# Patient Record
Sex: Female | Born: 2000 | Race: White | Hispanic: No | Marital: Single | Smoking: Current some day smoker
Health system: Southern US, Community
[De-identification: ages and names within clinical notes are randomized; demographics above are authoritative.]

## PROBLEM LIST (undated history)

## (undated) DIAGNOSIS — F909 Attention-deficit hyperactivity disorder, unspecified type: Secondary | ICD-10-CM

## (undated) DIAGNOSIS — F419 Anxiety disorder, unspecified: Secondary | ICD-10-CM

## (undated) HISTORY — DX: Attention-deficit hyperactivity disorder, unspecified type: F90.9

## (undated) HISTORY — DX: Anxiety disorder, unspecified: F41.9

## (undated) HISTORY — PX: TONSILLECTOMY: SUR1361

---
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
## 2018-02-02 ENCOUNTER — Other Ambulatory Visit: Payer: Self-pay | Admitting: Internal Medicine

## 2018-02-02 DIAGNOSIS — R059 Cough, unspecified: Secondary | ICD-10-CM

## 2018-02-02 DIAGNOSIS — R05 Cough: Secondary | ICD-10-CM

## 2018-02-03 ENCOUNTER — Ambulatory Visit: Admitting: Internal Medicine

## 2018-02-03 ENCOUNTER — Encounter: Payer: Self-pay | Admitting: Internal Medicine

## 2018-02-03 ENCOUNTER — Ambulatory Visit (INDEPENDENT_AMBULATORY_CARE_PROVIDER_SITE_OTHER): Admitting: Internal Medicine

## 2018-02-03 ENCOUNTER — Ambulatory Visit
Admission: RE | Admit: 2018-02-03 | Discharge: 2018-02-03 | Disposition: A | Discharge status: Home / Self Care | Source: Ambulatory Visit | Attending: Internal Medicine | Admitting: Internal Medicine

## 2018-02-03 VITALS — BP 116/80 | Resp 16 | Ht 64.6 in | Wt 244.0 lb

## 2018-02-03 DIAGNOSIS — R05 Cough: Secondary | ICD-10-CM | POA: Insufficient documentation

## 2018-02-03 DIAGNOSIS — J4541 Moderate persistent asthma with (acute) exacerbation: Secondary | ICD-10-CM

## 2018-02-03 DIAGNOSIS — R059 Cough, unspecified: Secondary | ICD-10-CM

## 2018-02-03 DIAGNOSIS — Z23 Encounter for immunization: Secondary | ICD-10-CM

## 2018-02-03 MED ORDER — ALBUTEROL SULFATE HFA 108 (90 BASE) MCG/ACT IN AERS: 1.0000 | INHALATION_SPRAY | Freq: Four times a day (QID) | RESPIRATORY_TRACT | 5 refills | Status: DC | PRN

## 2018-02-03 MED ORDER — FLUTICASONE FUROATE 200 MCG/ACT IN AEPB: 1.0000 | INHALATION_SPRAY | Freq: Every day | RESPIRATORY_TRACT | 11 refills | Status: DC

## 2018-02-03 NOTE — Progress Notes (Signed)
[name]      Assessment and Plan:  Cough. - Recurrent, suspect secondary to asthmatic bronchitis versus asthma. -  patient is given an inhaler for rescue to be used as needed.  Asthma/asthmatic bronchitis. - Patient is asked to complete her Asmanex sample, then start the prescribed Arnuity inhaler through the winter months.  She is given a co-pay coupon. - She is advised not to sleep with dogs in the bedroom when she goes home. - Flu shot today. - We will reevaluate in 5 months at the end of the flu season to see if we can discontinue the inhaled steroid.  Meds ordered this encounter  Medications  . Fluticasone Furoate (ARNUITY ELLIPTA) 200 MCG/ACT AEPB    Sig: Inhale 1 puff into the lungs daily.    Dispense:  30 each    Refill:  11  . albuterol (PROAIR HFA) 108 (90 Base) MCG/ACT inhaler    Sig: Inhale 1-2 puffs into the lungs every 6 (six) hours as needed for wheezing or shortness of breath.    Dispense:  1 Inhaler    Refill:  5   Return in about 5 months (around 07/05/2018).   Date: 02/03/2018  MRN# [id number] Dana Cooper Nov 07, 2000    Bre Dana Cooper is a 17 y.o. old female seen in consultation for chief complaint of:    Chief Complaint  Patient presents with  . Cough    x 1 week patient stop Asmanex after feeling better. Cough resumed with wheezing.    HPI:  The patient is a 17 year old female diagnosed with walking pneumonia in August 2019.  She had persistent cough that initially went away but then came back.  She was initially treated with a course of prednisone, doxycycline, codeine cough syrup, Tessalon. At last visit she was thought to have asthmatic bronchitis/postinfectious cough.  She was asked to take Tessalon, Robitussin, Asmanex inhaler.  She did the Asmanex inhaler and felt that improvement with complete resolution in her cough.   She subsequently stopped the Asmanex but then the cough recurred, and is now been present  for approximately 1 week. She feels that her breathing is short when she is coughing, but is not otherwise limited by breathing issues. She has 4 dogs at home in [place] where she goes home to during holidays. She has dog allergies, and has taken allergy shots, not taking them currently.  When she travels back home her dog sleep in bed with her.  She has several other environmental allergies.   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  **Chest x-ray 02/03/2018>> imaging personally reviewed, findings of chronic bronchitis, lungs are otherwise unremarkable.  Medication:    Current Outpatient Medications:  .  cetirizine (ZYRTEC) 10 MG tablet, Take 10 mg by mouth daily., Disp: , Rfl:  .  Mometasone Furoate (ASMANEX HFA) 200 MCG/ACT AERO, Inhale 1 puff into the lungs 2 (two) times daily., Disp: 13 g, Rfl: 0   Allergies:  Patient has no known allergies.  Review of Systems:  Constitutional: Feels well. Cardiovascular: Denies chest pain, exertional chest pain.  Pulmonary: Denies hemoptysis, pleuritic chest pain.   The remainder of systems were reviewed and were found to be negative other than what is documented in the HPI.    Physical Examination:   VS: BP 116/80   Resp 16   Ht 5' 4.6" (1.641 m)   Wt 244 lb (110.7 kg)   BMI 41.11 kg/m   General Appearance:  No distress  Neuro:without focal findings, mental status, speech normal, alert and oriented HEENT: PERRLA, EOM intact Pulmonary: No wheezing, No rales  CardiovascularNormal S1,S2.  No m/r/g.  Abdomen: Benign, Soft, non-tender, No masses Renal:  No costovertebral tenderness  GU:  No performed at this time. Endoc: No evident thyromegaly, no signs of acromegaly or Cushing features Skin:   warm, no rashes, no ecchymosis  Extremities: normal, no cyanosis, clubbing.      LABORATORY PANEL:   CBC No results for input(s): WBC, HGB, HCT, PLT in the last 168  hours. ------------------------------------------------------------------------------------------------------------------  Chemistries  No results for input(s): NA, K, CL, CO2, GLUCOSE, BUN, CREATININE, CALCIUM, MG, AST, ALT, ALKPHOS, BILITOT in the last 168 hours.  Invalid input(s): GFRCGP ------------------------------------------------------------------------------------------------------------------  Cardiac Enzymes No results for input(s): TROPONINI in the last 168 hours. ------------------------------------------------------------  RADIOLOGY:  No results found.     Thank  you for the consultation and for allowing [facility], Critical Care to assist in the care of your patient. Our recommendations are noted above.  Please contact us if we can be of further service.   Wells Guiles, M.D., F.C.C.P.  Board Certified in Internal Medicine, Pulmonary Medicine, Critical Care Medicine, and Sleep Medicine.   Pulmonary and Critical Care Office Number: [phone]   02/03/2018

## 2018-02-03 NOTE — Patient Instructions (Addendum)
Restart asmanex inhaler, once completed, start the arnuity inhaler that is prescribed today. Rinse mouth after use.  Keep pets out of bedroom.  Flu shot today.

## 2018-02-06 ENCOUNTER — Other Ambulatory Visit: Payer: Self-pay

## 2018-02-06 MED ORDER — FLUTICASONE PROPIONATE (INHAL) 50 MCG/BLIST IN AEPB: 1.0000 | INHALATION_SPRAY | Freq: Two times a day (BID) | RESPIRATORY_TRACT | 12 refills | Status: DC

## 2018-02-14 ENCOUNTER — Other Ambulatory Visit: Payer: Self-pay | Admitting: Internal Medicine

## 2018-02-14 NOTE — Telephone Encounter (Signed)
Asmanex not covered by patient insurance. Flovent was sent on 11/4. Called patient to see if she has started therapy yet. Patient has not yet picked up Asmanex. Nothing further needed.

## 2018-04-07 ENCOUNTER — Other Ambulatory Visit: Payer: Self-pay | Admitting: Nurse Practitioner

## 2018-09-07 ENCOUNTER — Telehealth: Payer: Self-pay | Admitting: Internal Medicine

## 2018-09-07 NOTE — Telephone Encounter (Signed)
Received PA request from [facility] for Arnuity.  Pt last seen 02/04/2019 and was instructed to f/u in 28mo. She does not currently have a pending appt.  Pt was instructed to restart asmanex inhaler, once completed, start the arnuity. Per 02/14/18 refill encounter, flovent was prescribed due to asmanex not being covered by insurance. Per CMM, covered alterative for Arnuity is Flovent.   I have spoke to pt to verify what inhaler she is currently taking. Pt stated that she is not currently taking any inhaler.  I have offered pt an appointment to discuss mediations. Pt stated that she has a ov with PCP today, due to cough. Pt stated that she would contact our office to schedule an appointment after her visit with PCP.  Nothing further is needed at this time.

## 2019-10-22 ENCOUNTER — Emergency Department
Admission: EM | Admit: 2019-10-22 | Discharge: 2019-10-22 | Disposition: A | Discharge status: Home / Self Care | Source: Home / Self Care | Attending: Emergency Medicine | Admitting: Emergency Medicine

## 2019-10-22 ENCOUNTER — Emergency Department
Admission: EM | Admit: 2019-10-22 | Discharge: 2019-10-22 | Discharge status: Against Medical Advice | Attending: Emergency Medicine | Admitting: Emergency Medicine

## 2019-10-22 ENCOUNTER — Encounter: Payer: Self-pay | Admitting: Emergency Medicine

## 2019-10-22 ENCOUNTER — Other Ambulatory Visit: Payer: Self-pay

## 2019-10-22 ENCOUNTER — Emergency Department

## 2019-10-22 DIAGNOSIS — R1031 Right lower quadrant pain: Secondary | ICD-10-CM

## 2019-10-22 DIAGNOSIS — F909 Attention-deficit hyperactivity disorder, unspecified type: Secondary | ICD-10-CM | POA: Insufficient documentation

## 2019-10-22 LAB — CBC
HCT: 43 % (ref 36.0–46.0)
Hemoglobin: 14.1 g/dL (ref 12.0–15.0)
MCH: 28.6 pg (ref 26.0–34.0)
MCHC: 32.8 g/dL (ref 30.0–36.0)
MCV: 87.2 fL (ref 80.0–100.0)
Platelets: 222 10*3/uL (ref 150–400)
RBC: 4.93 MIL/uL (ref 3.87–5.11)
RDW: 12.5 % (ref 11.5–15.5)
WBC: 6.3 10*3/uL (ref 4.0–10.5)
nRBC: 0 % (ref 0.0–0.2)

## 2019-10-22 LAB — WET PREP, GENITAL
Clue Cells Wet Prep HPF POC: NONE SEEN
Sperm: NONE SEEN
Trich, Wet Prep: NONE SEEN
Yeast Wet Prep HPF POC: NONE SEEN

## 2019-10-22 LAB — COMPREHENSIVE METABOLIC PANEL
ALT: 16 U/L (ref 0–44)
AST: 19 U/L (ref 15–41)
Albumin: 4.2 g/dL (ref 3.5–5.0)
Alkaline Phosphatase: 68 U/L (ref 38–126)
Anion gap: 7 (ref 5–15)
BUN: 10 mg/dL (ref 6–20)
CO2: 28 mmol/L (ref 22–32)
Calcium: 9.5 mg/dL (ref 8.9–10.3)
Chloride: 101 mmol/L (ref 98–111)
Creatinine, Ser: 0.78 mg/dL (ref 0.44–1.00)
GFR calc Af Amer: >60 mL/min (ref >60)
GFR calc non Af Amer: >60 mL/min (ref >60)
Glucose, Bld: 93 mg/dL (ref 70–99)
Potassium: 3.7 mmol/L (ref 3.5–5.1)
Sodium: 136 mmol/L (ref 135–145)
Total Bilirubin: 0.8 mg/dL (ref 0.3–1.2)
Total Protein: 8 g/dL (ref 6.5–8.1)

## 2019-10-22 LAB — URINALYSIS, COMPLETE (UACMP) WITH MICROSCOPIC
Bilirubin Urine: NEGATIVE
Glucose, UA: NEGATIVE mg/dL
Ketones, ur: NEGATIVE mg/dL
Leukocytes,Ua: NEGATIVE
Nitrite: NEGATIVE
Protein, ur: NEGATIVE mg/dL
Specific Gravity, Urine: 1.011 (ref 1.005–1.030)
pH: 7 (ref 5.0–8.0)

## 2019-10-22 LAB — CHLAMYDIA/NGC RT PCR (ARMC ONLY)
Chlamydia Tr: NOT DETECTED
N gonorrhoeae: NOT DETECTED

## 2019-10-22 LAB — POCT PREGNANCY, URINE: Preg Test, Ur: NEGATIVE

## 2019-10-22 LAB — LIPASE, BLOOD: Lipase: 27 U/L (ref 11–51)

## 2019-10-22 MED ORDER — ACETAMINOPHEN 500 MG PO TABS
1000.0000 mg | ORAL_TABLET | Freq: Once | ORAL | Status: AC
  Administered 2019-10-22: 1000 mg via ORAL
  Filled 2019-10-22: qty 2

## 2019-10-22 NOTE — ED Triage Notes (Signed)
Patient reports intermittent right lower abdominal pain times one month that has become worse over the past 2- 3 days.

## 2019-10-22 NOTE — ED Triage Notes (Signed)
Patient reports rlq abdominal pain off/on for the past month, worse over past 2-3 days.  Patient reports history of PCOS and IUD placement.

## 2019-10-22 NOTE — ED Provider Notes (Signed)
[facility] Emergency Department Provider Note   ____________________________________________   None    (approximate)  I have reviewed the triage vital signs and the nursing notes.   HISTORY  Chief Complaint Abdominal Pain   HPI Dana Cooper is a 19 y.o. female presents to the ED with complaint of intermittent right-sided abdominal pain that started approximately 1 month ago.  Patient states that she has also had intermittent fever and pain has gotten worse over the last 2 days.  Patient reports that she has had a white vaginal discharge without odor.  No vaginal itching.       Past Medical History:  Diagnosis Date  . ADHD (attention deficit hyperactivity disorder)   . Anxiety     There are no problems to display for this patient.   Past Surgical History:  Procedure Laterality Date  . TONSILLECTOMY      Prior to Admission medications   Medication Sig Start Date End Date Taking? Authorizing Provider  albuterol (PROAIR HFA) 108 (90 Base) MCG/ACT inhaler Inhale 1-2 puffs into the lungs every 6 (six) hours as needed for wheezing or shortness of breath. 02/03/18   Shane Crutch, MD  cetirizine (ZYRTEC) 10 MG tablet Take 10 mg by mouth daily.    [provider]  fluticasone (FLOVENT DISKUS) 50 MCG/BLIST diskus inhaler Inhale 1 puff into the lungs 2 (two) times daily. 02/06/18   Shane Crutch, MD    Allergies Patient has no known allergies.  Family History  Problem Relation Age of Onset  . Anxiety disorder Mother   . ADD / ADHD Mother   . ADD / ADHD Father     Social History Social History   Tobacco Use  . Smoking status: Never Smoker  . Smokeless tobacco: Never Used  Vaping Use  . Vaping Use: Never used  Substance Use Topics  . Alcohol use: No  . Drug use: No    Review of Systems Constitutional: No fever/chills Eyes: No visual changes. ENT: No sore throat. Cardiovascular: Denies chest  pain. Respiratory: Denies shortness of breath. Gastrointestinal: Positive abdominal pain.  No nausea, no vomiting.  No diarrhea.  No constipation. Genitourinary: Negative for dysuria. Musculoskeletal: Negative for muscle aches. Skin: Negative for rash. Neurological: Negative for headaches, focal weakness or numbness.  ____________________________________________   PHYSICAL EXAM:  VITAL SIGNS: ED Triage Vitals [10/22/19 0534]  Enc Vitals Group     BP 115/77     Pulse Rate 95     Resp 18     Temp 98.5 F (36.9 C)     Temp Source Oral     SpO2 97 %     Weight 250 lb (113.4 kg)     Height 5\' 5"  (1.651 m)     Head Circumference      Peak Flow      Pain Score      Pain Loc      Pain Edu?      Excl. in GC?    Constitutional: Alert and oriented. Well appearing and in no acute distress. Eyes: Conjunctivae are normal.  Head: Atraumatic. Nose: No congestion/rhinnorhea. Neck: No stridor.   Cardiovascular: Normal rate, regular rhythm. Grossly normal heart sounds.  Good peripheral circulation. Respiratory: Normal respiratory effort.  No retractions. Lungs CTAB. Gastrointestinal: Soft.  No distention.  Bowel sounds normoactive x4 quadrants.  No referred pain, McBurney's point tenderness, rebound or  CVA tenderness. Genitourinary: External genitalia is unremarkable.  IUD strings are visible from the cervical  os.  No exudate is noted in the vagina.  No cervical motion tenderness or tenderness on the left ovary.  Right ovary with minimal tenderness and no masses are appreciated. Musculoskeletal: Moves upper and lower extremities with any difficulty normal gait was noted. Neurologic:  Normal speech and language. No gross focal neurologic deficits are appreciated. No gait instability. Skin:  Skin is warm, dry and intact. No rash noted. Psychiatric: Mood and affect are normal. Speech and behavior are normal.  ____________________________________________   LABS (all labs ordered are  listed, but only abnormal results are displayed)  Labs Reviewed  WET PREP, GENITAL - Abnormal; Notable for the following components:      Result Value   WBC, Wet Prep HPF POC MODERATE (*)    All other components within normal limits  CHLAMYDIA/NGC RT PCR ([facility] ONLY)     RADIOLOGY   Official radiology report(s): US PELVIC COMPLETE WITH TRANSVAGINAL  Result Date: 10/22/2019 CLINICAL DATA:  Pelvic pain, RIGHT lower quadrant pain for 1 month, IUD; uncertain LMP EXAM: TRANSABDOMINAL AND TRANSVAGINAL ULTRASOUND OF PELVIS TECHNIQUE: Both transabdominal and transvaginal ultrasound examinations of the pelvis were performed. Transabdominal technique was performed for global imaging of the pelvis including uterus, ovaries, adnexal regions, and pelvic cul-de-sac. It was necessary to proceed with endovaginal exam following the transabdominal exam to visualize the uterus, endometrium, and ovaries. COMPARISON:  None FINDINGS: Uterus Measurements: 6.0 x 3.4 x 4.0 cm = volume: 43 mL. Anteverted. Normal morphology without mass Endometrium Thickness: 6 mm. IUD in expected position at the upper uterine segment endometrial canal. No endometrial fluid or focal abnormality. Right ovary Measurements: 2.8 x 1.6 x 2.1 cm = volume: 5 mL. Normal morphology without mass Left ovary Measurements: 3.0 x 1.3 x 2.1 cm = volume: 4 mL. Normal morphology without mass Other findings No free pelvic fluid.  No adnexal masses. IMPRESSION: IUD in expected position at the upper uterine segment endometrial canal. Otherwise normal exam. Electronically Signed   By: Ulyses Southward M.D.   On: 10/22/2019 13:11    ____________________________________________   PROCEDURES  Procedure(s) performed (including Critical Care):  Procedures   ____________________________________________   INITIAL IMPRESSION / ASSESSMENT AND PLAN / ED COURSE  As part of my medical decision making, I reviewed the following data within the electronic  MEDICAL RECORD NUMBER Notes from prior ED visits and [place] Controlled Substance Database  Lab work and urinalysis that was done earlier today prior to patient eloping from the waiting room due to the wait to be seen in the emergency room was within normal limits.  Patient had some mild tenderness on palpation of the right ovary but no cervical motion tenderness.  Wet prep showed moderate WBCs but without clue cells, trichomonas and gonorrhea and Chlamydia test were negative.  Ultrasound was negative for ovarian cyst or rupture.  Because patient has had intermittent pain for 1 month she is to follow-up with [facility] acute care or return to the emergency department if her symptoms become worse.  She will continue taking Tylenol as needed.  ____________________________________________   FINAL CLINICAL IMPRESSION(S) / ED DIAGNOSES  Final diagnoses:  RLQ abdominal pain     ED Discharge Orders    None       Note:  This document was prepared using Dragon voice recognition software and may include unintentional dictation errors.    Tommi Rumps, PA-C 10/22/19 1403    Emily Filbert, MD 10/22/19 [id number]

## 2019-10-22 NOTE — ED Notes (Signed)
Patient called for vital sign check with no answer. 

## 2019-10-22 NOTE — Discharge Instructions (Addendum)
Follow-up the [facility] acute care if any continued problems or return to the emergency department if not improving or any worsening of your symptoms.  Take Tylenol as needed for pain.  Today your gonorrhea and Chlamydia test were negative and no bacteria was noted.  Lab work is normal.  You may also follow-up with your OB/GYN if any continued problems.

## 2019-10-22 NOTE — ED Notes (Signed)
Patient called for vital signs with no answer. 

## 2019-10-22 NOTE — ED Notes (Signed)
See triage note    Presents with intermittent right sided abd pain  States pain started about 1 month ago  Also has had intermittent fever but is currently afebrile states that the pain has gotten worse over the past 2 days

## 2020-05-12 ENCOUNTER — Emergency Department: Payer: BC Managed Care – PPO

## 2020-05-12 ENCOUNTER — Other Ambulatory Visit: Payer: Self-pay

## 2020-05-12 ENCOUNTER — Emergency Department
Admission: EM | Admit: 2020-05-12 | Discharge: 2020-05-12 | Disposition: A | Discharge status: Home / Self Care | Payer: BC Managed Care – PPO | Attending: Emergency Medicine | Admitting: Emergency Medicine

## 2020-05-12 DIAGNOSIS — Z20822 Contact with and (suspected) exposure to covid-19: Secondary | ICD-10-CM | POA: Insufficient documentation

## 2020-05-12 DIAGNOSIS — F17200 Nicotine dependence, unspecified, uncomplicated: Secondary | ICD-10-CM | POA: Diagnosis not present

## 2020-05-12 DIAGNOSIS — U071 COVID-19: Secondary | ICD-10-CM

## 2020-05-12 DIAGNOSIS — J1282 Pneumonia due to coronavirus disease 2019: Secondary | ICD-10-CM

## 2020-05-12 DIAGNOSIS — J189 Pneumonia, unspecified organism: Secondary | ICD-10-CM | POA: Insufficient documentation

## 2020-05-12 DIAGNOSIS — J069 Acute upper respiratory infection, unspecified: Secondary | ICD-10-CM | POA: Insufficient documentation

## 2020-05-12 DIAGNOSIS — R059 Cough, unspecified: Secondary | ICD-10-CM | POA: Diagnosis present

## 2020-05-12 LAB — CBC WITH DIFFERENTIAL/PLATELET
Abs Immature Granulocytes: 0.02 10*3/uL (ref 0.00–0.07)
Basophils Absolute: 0 10*3/uL (ref 0.0–0.1)
Basophils Relative: 0 %
Eosinophils Absolute: 0.1 10*3/uL (ref 0.0–0.5)
Eosinophils Relative: 1 %
HCT: 40.9 % (ref 36.0–46.0)
Hemoglobin: 13.8 g/dL (ref 12.0–15.0)
Immature Granulocytes: 0 %
Lymphocytes Relative: 19 %
Lymphs Abs: 1.3 10*3/uL (ref 0.7–4.0)
MCH: 28 pg (ref 26.0–34.0)
MCHC: 33.7 g/dL (ref 30.0–36.0)
MCV: 83 fL (ref 80.0–100.0)
Monocytes Absolute: 0.6 10*3/uL (ref 0.1–1.0)
Monocytes Relative: 9 %
Neutro Abs: 4.9 10*3/uL (ref 1.7–7.7)
Neutrophils Relative %: 71 %
Platelets: 146 10*3/uL — ABNORMAL LOW (ref 150–400)
RBC: 4.93 MIL/uL (ref 3.87–5.11)
RDW: 13.2 % (ref 11.5–15.5)
WBC: 7 10*3/uL (ref 4.0–10.5)
nRBC: 0 % (ref 0.0–0.2)

## 2020-05-12 LAB — BASIC METABOLIC PANEL
Anion gap: 10 (ref 5–15)
BUN: 8 mg/dL (ref 6–20)
CO2: 23 mmol/L (ref 22–32)
Calcium: 8.7 mg/dL — ABNORMAL LOW (ref 8.9–10.3)
Chloride: 103 mmol/L (ref 98–111)
Creatinine, Ser: 0.55 mg/dL (ref 0.44–1.00)
GFR, Estimated: >60 mL/min (ref >60)
Glucose, Bld: 97 mg/dL (ref 70–99)
Potassium: 3.8 mmol/L (ref 3.5–5.1)
Sodium: 136 mmol/L (ref 135–145)

## 2020-05-12 LAB — PROTIME-INR
INR: 1.1 (ref 0.8–1.2)
Prothrombin Time: 13.3 seconds (ref 11.4–15.2)

## 2020-05-12 LAB — SARS CORONAVIRUS 2 (TAT 6-24 HRS): SARS Coronavirus 2: NEGATIVE

## 2020-05-12 MED ORDER — SODIUM CHLORIDE 0.9 % IV BOLUS
1000.0000 mL | Freq: Once | INTRAVENOUS | Status: AC
  Administered 2020-05-12: 1000 mL via INTRAVENOUS

## 2020-05-12 MED ORDER — ACETAMINOPHEN 325 MG PO TABS
650.0000 mg | ORAL_TABLET | Freq: Once | ORAL | Status: AC
  Administered 2020-05-12: 650 mg via ORAL
  Filled 2020-05-12: qty 2

## 2020-05-12 MED ORDER — IOHEXOL 350 MG/ML SOLN
75.0000 mL | Freq: Once | INTRAVENOUS | Status: AC | PRN
  Administered 2020-05-12: 75 mL via INTRAVENOUS
  Filled 2020-05-12: qty 75

## 2020-05-12 MED ORDER — PREDNISONE 10 MG (21) PO TBPK: ORAL_TABLET | ORAL | 0 refills | Status: DC

## 2020-05-12 MED ORDER — ALBUTEROL SULFATE HFA 108 (90 BASE) MCG/ACT IN AERS: 2.0000 | INHALATION_SPRAY | RESPIRATORY_TRACT | 1 refills | Status: DC | PRN

## 2020-05-12 MED ORDER — AZITHROMYCIN 250 MG PO TABS: ORAL_TABLET | ORAL | 0 refills | Status: DC

## 2020-05-12 NOTE — ED Triage Notes (Signed)
Pt c/o cough with body aches and fever for the past 3-5 days, pt is in NAD.Marland Kitchen

## 2020-05-12 NOTE — ED Notes (Signed)
See triage note  Presents with cough and low grade fever  States sxs' started about 3-5 days ago   Also having  some discomfort to right "lung" area  States cough is non productive

## 2020-05-12 NOTE — Discharge Instructions (Signed)
Take Tylenol or ibuprofen if needed for pain or fever.  Follow-up with primary care in approximately 4 weeks for repeat chest x-ray.  Quarantine for the next 7 days or until your symptoms are completely gone.

## 2020-05-12 NOTE — ED Provider Notes (Signed)
[facility] Emergency Department Provider Note ____________________________________________   Event Date/Time   First MD Initiated Contact with Patient 05/12/20 [phone]     (approximate)  I have reviewed the triage vital signs and the nursing notes.   HISTORY  Chief Complaint URI  HPI Dana Cooper is a 20 y.o. female with history of pulmonary embolus presents to the emergency department for treatment and evaluation of shortness of breath, cough, fever, body aches, headache x 3-5 days. She feels that the shortness of breath is very similar to when she had a PE in 2020. She is not currently on blood thinners.       Past Medical History:  Diagnosis Date  . ADHD (attention deficit hyperactivity disorder)   . Anxiety     There are no problems to display for this patient.   Past Surgical History:  Procedure Laterality Date  . TONSILLECTOMY      Prior to Admission medications   Medication Sig Start Date End Date Taking? Authorizing Provider  azithromycin (ZITHROMAX) 250 MG tablet 2 tablets today, then 1 tablet for the next 4 days. 05/12/20  Yes Jermani Eberlein B, FNP  predniSONE (STERAPRED UNI-PAK 21 TAB) 10 MG (21) TBPK tablet Take 6 tablets on the first day and decrease by 1 tablet each day until finished. 05/12/20  Yes Shanie Mauzy B, FNP  albuterol (VENTOLIN HFA) 108 (90 Base) MCG/ACT inhaler Inhale 2 puffs into the lungs every 4 (four) hours as needed for wheezing or shortness of breath. 05/12/20   Tayon Parekh, Rulon Eisenmenger B, FNP  cetirizine (ZYRTEC) 10 MG tablet Take 10 mg by mouth daily.    [provider]  fluticasone (FLOVENT DISKUS) 50 MCG/BLIST diskus inhaler Inhale 1 puff into the lungs 2 (two) times daily. 02/06/18   Shane Crutch, MD    Allergies Patient has no known allergies.  Family History  Problem Relation Age of Onset  . Anxiety disorder Mother   . ADD / ADHD Mother   . ADD / ADHD Father     Social History Social History    Tobacco Use  . Smoking status: Current Some Day Smoker  . Smokeless tobacco: Never Used  Vaping Use  . Vaping Use: Never used  Substance Use Topics  . Alcohol use: Yes  . Drug use: Yes    Types: Marijuana    Review of Systems  Constitutional:  Eyes: No visual changes. ENT: No sore throat. Cardiovascular: Denies chest pain. Respiratory: Positive for shortness of breath. Gastrointestinal: No abdominal pain.  Positive for nausea, no vomiting.  No diarrhea.  No constipation. Genitourinary: Negative for dysuria. Musculoskeletal: Negative for back pain. Skin: Negative for rash. Neurological: Positive for headaches, negative for focal weakness or numbness. ____________________________________________   PHYSICAL EXAM:  VITAL SIGNS: ED Triage Vitals  Enc Vitals Group     BP 05/12/20 0811 125/85     Pulse Rate 05/12/20 0811 (!) 120     Resp 05/12/20 0811 20     Temp 05/12/20 0811 (!) 100.4 F (38 C)     Temp Source 05/12/20 0811 Oral     SpO2 05/12/20 0811 95 %     Weight 05/12/20 0843 249 lb 1.9 oz (113 kg)     Height 05/12/20 0843 5\' 5"  (1.651 m)     Head Circumference --      Peak Flow --      Pain Score 05/12/20 0811 5     Pain Loc --  Pain Edu? --      Excl. in GC? --     Constitutional: Alert and oriented. Well appearing and in no acute distress. Eyes: Conjunctivae are normal. Head: Atraumatic. Nose: No congestion/rhinnorhea. Mouth/Throat: Mucous membranes are moist.  Oropharynx non-erythematous. Neck: No stridor.   Hematological/Lymphatic/Immunilogical: No cervical lymphadenopathy. Cardiovascular: Normal rate, regular rhythm. Grossly normal heart sounds.  Good peripheral circulation. Respiratory: Normal respiratory effort.  No retractions. Lungs CTAB. Gastrointestinal: Soft and nontender. No distention. No abdominal bruits. No CVA tenderness. Genitourinary:  Musculoskeletal: No lower extremity tenderness nor edema.  No joint effusions. Neurologic:   Normal speech and language. No gross focal neurologic deficits are appreciated. No gait instability. Skin:  Skin is warm, dry and intact. No rash noted. Psychiatric: Mood and affect are normal. Speech and behavior are normal.  ____________________________________________   LABS (all labs ordered are listed, but only abnormal results are displayed)  Labs Reviewed  BASIC METABOLIC PANEL - Abnormal; Notable for the following components:      Result Value   Calcium 8.7 (*)    All other components within normal limits  CBC WITH DIFFERENTIAL/PLATELET - Abnormal; Notable for the following components:   Platelets 146 (*)    All other components within normal limits  SARS CORONAVIRUS 2 (TAT 6-24 HRS)  PROTIME-INR  POC URINE PREG, ED   ____________________________________________  EKG  Not indicated. ____________________________________________  RADIOLOGY  ED MD interpretation:    CTA negative for PE.  I, Dany Harten, personally viewed and evaluated these images (plain radiographs) as part of my medical decision making, as well as reviewing the written report by the radiologist.  Official radiology report(s): DG Chest 2 View  Result Date: 05/12/2020 CLINICAL DATA:  Shortness of breath. EXAM: CHEST - 2 VIEW COMPARISON:  February 03, 2018. FINDINGS: The heart size and mediastinal contours are within normal limits. No pneumothorax or pleural effusion is noted. Right lung is clear. Minimal left basilar atelectasis or infiltrate is noted. The visualized skeletal structures are unremarkable. IMPRESSION: Minimal left basilar atelectasis or infiltrate is noted. Electronically Signed   By: Lupita Raider M.D.   On: 05/12/2020 08:39   CT Angio Chest PE W and/or Wo Contrast  Result Date: 05/12/2020 CLINICAL DATA:  Suspected PE. Cough and body aches and fever for 3-5 days. EXAM: CT ANGIOGRAPHY CHEST WITH CONTRAST TECHNIQUE: Multidetector CT imaging of the chest was performed using the standard  protocol during bolus administration of intravenous contrast. Multiplanar CT image reconstructions and MIPs were obtained to evaluate the vascular anatomy. CONTRAST:  32mL OMNIPAQUE IOHEXOL 350 MG/ML SOLN COMPARISON:  Chest x-ray of May 12, 2020 FINDINGS: Cardiovascular: Heart size is normal without pericardial effusion. Aortic caliber is normal. Contour of the aorta is smooth. Central pulmonary arteries are normal caliber. No sign of pulmonary embolism to the segmental level. Sub segmental branches show limited assessment. Mediastinum/Nodes: No hilar adenopathy, no mediastinal lymphadenopathy. No axillary lymphadenopathy esophagus grossly normal. Lungs/Pleura: Nodular consolidative changes in the LEFT lung base with associated ground-glass. Similar nodularity at the RIGHT lung base though isolated measuring 7 mm. RIGHT upper lobe ground-glass. No lobar level consolidative process. No pleural effusion. Upper Abdomen: Question lobular hepatic contours and top-normal size of the spleen. No pericholecystic stranding, gallbladder is incompletely imaged. Liver density could be indicative of steatosis though phase of contrast does not allow for assessment. Musculoskeletal: No acute musculoskeletal finding. No destructive bone process. Review of the MIP images confirms the above findings. IMPRESSION: 1. No sign of pulmonary embolism  to the segmental level. Sub segmental branches show limited assessment. 2. Signs of multifocal pneumonia, consider the possibility of COVID-19 infection though not entirely typical. Follow-up is suggested to ensure resolution. 3. Question lobular hepatic contours and top-normal size of the spleen. Correlate with any clinical or laboratory evidence of liver disease. Electronically Signed   By: Donzetta Kohut M.D.   On: 05/12/2020 11:59    ____________________________________________   PROCEDURES  Procedure(s) performed (including Critical  Care):  Procedures  ____________________________________________   INITIAL IMPRESSION / ASSESSMENT AND PLAN     20 year old female presents to the emergency department for treatment and evaluation of shortness of breath.  She is concerned that she may have another pulmonary embolus.  She has had 1 in the past but is not currently on any blood thinners.  Symptoms are more consistent with COVID-19.  She is a current cigarette smoker and is on birth control with a history of PE therefore CTA will be performed.  DIFFERENTIAL DIAGNOSIS  COVID-19, COVID-19 pneumonia, pulmonary embolus, URI  ED COURSE  CT does not show concern for pulmonary embolus however it does show likely COVID-19 pneumonia.  She will be treated with azithromycin, prednisone, and albuterol.  Quarantine instructions were discussed.  She is to follow-up with her primary care provider in a few weeks to have a repeat chest x-ray.  She was advised to return to the emergency department for symptoms of change or worsen if she is unable to schedule appointment.    ___________________________________________   FINAL CLINICAL IMPRESSION(S) / ED DIAGNOSES  Final diagnoses:  Pneumonia due to COVID-19 virus     ED Discharge Orders         Ordered    azithromycin (ZITHROMAX) 250 MG tablet        05/12/20 1213    albuterol (VENTOLIN HFA) 108 (90 Base) MCG/ACT inhaler  Every 4 hours PRN,   Status:  Discontinued        05/12/20 1213    predniSONE (STERAPRED UNI-PAK 21 TAB) 10 MG (21) TBPK tablet        05/12/20 1213    albuterol (VENTOLIN HFA) 108 (90 Base) MCG/ACT inhaler  Every 4 hours PRN        05/12/20 1217           Dana Cooper was evaluated in Emergency Department on 05/12/2020 for the symptoms described in the history of present illness. She was evaluated in the context of the global COVID-19 pandemic, which necessitated consideration that the patient might be at risk for infection with the SARS-CoV-2 virus that  causes COVID-19. Institutional protocols and algorithms that pertain to the evaluation of patients at risk for COVID-19 are in a state of rapid change based on information released by regulatory bodies including the CDC and federal and state organizations. These policies and algorithms were followed during the patient's care in the ED.   Note:  This document was prepared using Dragon voice recognition software and may include unintentional dictation errors.   Chinita Pester, FNP 05/12/20 1309    Delton Prairie, MD 05/12/20 1537

## 2020-05-14 ENCOUNTER — Other Ambulatory Visit: Payer: Self-pay

## 2020-05-14 ENCOUNTER — Emergency Department
Admission: EM | Admit: 2020-05-14 | Discharge: 2020-05-14 | Disposition: A | Discharge status: Home / Self Care | Payer: BC Managed Care – PPO | Attending: Emergency Medicine | Admitting: Emergency Medicine

## 2020-05-14 ENCOUNTER — Emergency Department: Payer: BC Managed Care – PPO

## 2020-05-14 DIAGNOSIS — Z86711 Personal history of pulmonary embolism: Secondary | ICD-10-CM | POA: Insufficient documentation

## 2020-05-14 DIAGNOSIS — F17290 Nicotine dependence, other tobacco product, uncomplicated: Secondary | ICD-10-CM | POA: Diagnosis not present

## 2020-05-14 DIAGNOSIS — R0789 Other chest pain: Secondary | ICD-10-CM | POA: Diagnosis not present

## 2020-05-14 DIAGNOSIS — J189 Pneumonia, unspecified organism: Secondary | ICD-10-CM | POA: Insufficient documentation

## 2020-05-14 DIAGNOSIS — R002 Palpitations: Secondary | ICD-10-CM | POA: Diagnosis present

## 2020-05-14 DIAGNOSIS — T40711A Poisoning by cannabis, accidental (unintentional), initial encounter: Secondary | ICD-10-CM | POA: Diagnosis not present

## 2020-05-14 DIAGNOSIS — Z20822 Contact with and (suspected) exposure to covid-19: Secondary | ICD-10-CM | POA: Insufficient documentation

## 2020-05-14 DIAGNOSIS — R0602 Shortness of breath: Secondary | ICD-10-CM | POA: Insufficient documentation

## 2020-05-14 DIAGNOSIS — N3000 Acute cystitis without hematuria: Secondary | ICD-10-CM | POA: Insufficient documentation

## 2020-05-14 DIAGNOSIS — R739 Hyperglycemia, unspecified: Secondary | ICD-10-CM | POA: Insufficient documentation

## 2020-05-14 DIAGNOSIS — R509 Fever, unspecified: Secondary | ICD-10-CM | POA: Insufficient documentation

## 2020-05-14 LAB — COMPREHENSIVE METABOLIC PANEL
ALT: 18 U/L (ref 0–44)
AST: 24 U/L (ref 15–41)
Albumin: 4.3 g/dL (ref 3.5–5.0)
Alkaline Phosphatase: 60 U/L (ref 38–126)
Anion gap: 13 (ref 5–15)
BUN: 8 mg/dL (ref 6–20)
CO2: 21 mmol/L — ABNORMAL LOW (ref 22–32)
Calcium: 9 mg/dL (ref 8.9–10.3)
Chloride: 105 mmol/L (ref 98–111)
Creatinine, Ser: 0.72 mg/dL (ref 0.44–1.00)
GFR, Estimated: >60 mL/min (ref >60)
Glucose, Bld: 262 mg/dL — ABNORMAL HIGH (ref 70–99)
Potassium: 3.5 mmol/L (ref 3.5–5.1)
Sodium: 139 mmol/L (ref 135–145)
Total Bilirubin: 0.9 mg/dL (ref 0.3–1.2)
Total Protein: 7.7 g/dL (ref 6.5–8.1)

## 2020-05-14 LAB — URINALYSIS, COMPLETE (UACMP) WITH MICROSCOPIC
Bacteria, UA: NONE SEEN
Bilirubin Urine: NEGATIVE
Glucose, UA: >=500 mg/dL — AB
Hgb urine dipstick: NEGATIVE
Ketones, ur: NEGATIVE mg/dL
Leukocytes,Ua: NEGATIVE
Nitrite: POSITIVE — AB
Protein, ur: NEGATIVE mg/dL
Specific Gravity, Urine: 1.037 — ABNORMAL HIGH (ref 1.005–1.030)
pH: 7 (ref 5.0–8.0)

## 2020-05-14 LAB — CBC WITH DIFFERENTIAL/PLATELET
Abs Immature Granulocytes: 0.09 10*3/uL — ABNORMAL HIGH (ref 0.00–0.07)
Basophils Absolute: 0 10*3/uL (ref 0.0–0.1)
Basophils Relative: 0 %
Eosinophils Absolute: 0 10*3/uL (ref 0.0–0.5)
Eosinophils Relative: 0 %
HCT: 41.9 % (ref 36.0–46.0)
Hemoglobin: 13.9 g/dL (ref 12.0–15.0)
Immature Granulocytes: 1 %
Lymphocytes Relative: 12 %
Lymphs Abs: 1.4 10*3/uL (ref 0.7–4.0)
MCH: 28.3 pg (ref 26.0–34.0)
MCHC: 33.2 g/dL (ref 30.0–36.0)
MCV: 85.3 fL (ref 80.0–100.0)
Monocytes Absolute: 0.4 10*3/uL (ref 0.1–1.0)
Monocytes Relative: 4 %
Neutro Abs: 9.7 10*3/uL — ABNORMAL HIGH (ref 1.7–7.7)
Neutrophils Relative %: 83 %
Platelets: 204 10*3/uL (ref 150–400)
RBC: 4.91 MIL/uL (ref 3.87–5.11)
RDW: 13.4 % (ref 11.5–15.5)
WBC: 11.6 10*3/uL — ABNORMAL HIGH (ref 4.0–10.5)
nRBC: 0 % (ref 0.0–0.2)

## 2020-05-14 LAB — HEMOGLOBIN A1C
Hgb A1c MFr Bld: 4.9 % (ref 4.8–5.6)
Mean Plasma Glucose: 93.93 mg/dL

## 2020-05-14 LAB — HCG, QUANTITATIVE, PREGNANCY: hCG, Beta Chain, Quant, S: <1 m[IU]/mL (ref <5)

## 2020-05-14 LAB — RESP PANEL BY RT-PCR (FLU A&B, COVID) ARPGX2
Influenza A by PCR: NEGATIVE
Influenza B by PCR: NEGATIVE
SARS Coronavirus 2 by RT PCR: NEGATIVE

## 2020-05-14 LAB — TROPONIN I (HIGH SENSITIVITY): Troponin I (High Sensitivity): 3 ng/L (ref <18)

## 2020-05-14 LAB — MAGNESIUM: Magnesium: 2.2 mg/dL (ref 1.7–2.4)

## 2020-05-14 LAB — PROCALCITONIN: Procalcitonin: <0.1 ng/mL

## 2020-05-14 MED ORDER — IOHEXOL 350 MG/ML SOLN
100.0000 mL | Freq: Once | INTRAVENOUS | Status: AC | PRN
  Administered 2020-05-14: 100 mL via INTRAVENOUS

## 2020-05-14 MED ORDER — LACTATED RINGERS IV BOLUS
1000.0000 mL | Freq: Once | INTRAVENOUS | Status: AC
  Administered 2020-05-14: 1000 mL via INTRAVENOUS

## 2020-05-14 MED ORDER — CEPHALEXIN 500 MG PO CAPS: 500.0000 mg | ORAL_CAPSULE | Freq: Four times a day (QID) | ORAL | 0 refills | Status: AC

## 2020-05-14 NOTE — ED Notes (Signed)
Pt has returned from CT dept -- fluids resumed and cardiac and cont pulse ox resumed.

## 2020-05-14 NOTE — ED Provider Notes (Signed)
[facility] Emergency Department Provider Note  ____________________________________________   Event Date/Time   First MD Initiated Contact with Patient 05/14/20 [phone]     (approximate)  I have reviewed the triage vital signs and the nursing notes.   HISTORY  Chief Complaint Palpitations   HPI Dana Cooper is a 20 y.o. female past medical history of ADHD, anxiety and previous PE not anticoagulated as well as recent diagnosis of community-acquired pneumonia on 2/9 who presents via EMS from home for assessment of some palpitations.  Patient states that she has actually had some cough shortness of breath, chest soreness, and intermittent headaches as well as fevers and chills the last couple of days but developed some palpitations sometimes early this morning after taking a CBD edible.  Patient denies any acute headache, earache, sore throat, vomiting, diarrhea, dysuria, abdominal pain, back pain, rash or extremity pain.  No recent falls or injuries.  She does endorse nicotine vape intermittently but denies any significant EtOH or illicit drug use.  States she has been taking her antibiotics as prescribed.         Past Medical History:  Diagnosis Date  . ADHD (attention deficit hyperactivity disorder)   . Anxiety     There are no problems to display for this patient.   Past Surgical History:  Procedure Laterality Date  . TONSILLECTOMY      Prior to Admission medications   Medication Sig Start Date End Date Taking? Authorizing Provider  cephALEXin (KEFLEX) 500 MG capsule Take 1 capsule (500 mg total) by mouth 4 (four) times daily for 7 days. 05/14/20 05/21/20 Yes Gilles Chiquito, MD  albuterol (VENTOLIN HFA) 108 (90 Base) MCG/ACT inhaler Inhale 2 puffs into the lungs every 4 (four) hours as needed for wheezing or shortness of breath. 05/12/20   Triplett, Rulon Eisenmenger B, FNP  azithromycin (ZITHROMAX) 250 MG tablet 2 tablets today, then 1 tablet for the next 4 days.  05/12/20   Triplett, Rulon Eisenmenger B, FNP  cetirizine (ZYRTEC) 10 MG tablet Take 10 mg by mouth daily.    [provider]  fluticasone (FLOVENT DISKUS) 50 MCG/BLIST diskus inhaler Inhale 1 puff into the lungs 2 (two) times daily. 02/06/18   Shane Crutch, MD    Allergies Patient has no known allergies.  Family History  Problem Relation Age of Onset  . Anxiety disorder Mother   . ADD / ADHD Mother   . ADD / ADHD Father     Social History Social History   Tobacco Use  . Smoking status: Current Some Day Smoker  . Smokeless tobacco: Never Used  Vaping Use  . Vaping Use: Never used  Substance Use Topics  . Alcohol use: Yes  . Drug use: Yes    Types: Marijuana    Review of Systems  Review of Systems  Constitutional: Positive for chills, fever and malaise/fatigue.  HENT: Negative for sore throat.   Eyes: Negative for pain.  Respiratory: Positive for cough and shortness of breath. Negative for stridor.   Cardiovascular: Positive for chest pain and palpitations.  Gastrointestinal: Negative for vomiting.  Genitourinary: Positive for frequency.  Skin: Negative for rash.  Neurological: Negative for seizures, loss of consciousness and headaches.  Psychiatric/Behavioral: Negative for suicidal ideas.  All other systems reviewed and are negative.     ____________________________________________   PHYSICAL EXAM:  VITAL SIGNS: ED Triage Vitals  Enc Vitals Group     BP      Pulse  Resp      Temp      Temp src      SpO2      Weight      Height      Head Circumference      Peak Flow      Pain Score      Pain Loc      Pain Edu?      Excl. in GC?    Vitals:   05/14/20 0240  BP: 125/70  Pulse: (!) 115  Resp: 20  Temp: 98.5 F (36.9 C)  SpO2: 96%   Physical Exam Vitals and nursing note reviewed.  Constitutional:      Appearance: She is well-developed and well-nourished. She is obese.  HENT:     Head: Normocephalic and atraumatic.     Right Ear:  External ear normal.     Left Ear: External ear normal.     Nose: Nose normal.  Eyes:     Conjunctiva/sclera: Conjunctivae normal.  Cardiovascular:     Rate and Rhythm: Regular rhythm. Tachycardia present.     Heart sounds: No murmur heard.   Pulmonary:     Effort: Pulmonary effort is normal. No respiratory distress.     Breath sounds: Normal breath sounds.  Abdominal:     Palpations: Abdomen is soft.     Tenderness: There is no abdominal tenderness.  Musculoskeletal:        General: No edema.     Cervical back: Neck supple.     Right lower leg: No edema.     Left lower leg: No edema.  Skin:    General: Skin is warm and dry.     Capillary Refill: Capillary refill takes less than 2 seconds.  Neurological:     Mental Status: She is alert and oriented to person, place, and time.  Psychiatric:        Mood and Affect: Mood and affect and mood normal.      ____________________________________________   LABS (all labs ordered are listed, but only abnormal results are displayed)  Labs Reviewed  CBC WITH DIFFERENTIAL/PLATELET - Abnormal; Notable for the following components:      Result Value   WBC 11.6 (*)    Neutro Abs 9.7 (*)    Abs Immature Granulocytes 0.09 (*)    All other components within normal limits  COMPREHENSIVE METABOLIC PANEL - Abnormal; Notable for the following components:   CO2 21 (*)    Glucose, Bld 262 (*)    All other components within normal limits  URINALYSIS, COMPLETE (UACMP) WITH MICROSCOPIC - Abnormal; Notable for the following components:   Color, Urine AMBER (*)    APPearance CLEAR (*)    Specific Gravity, Urine 1.037 (*)    Glucose, UA >=500 (*)    Nitrite POSITIVE (*)    All other components within normal limits  RESP PANEL BY RT-PCR (FLU A&B, COVID) ARPGX2  URINE CULTURE  MAGNESIUM  HCG, QUANTITATIVE, PREGNANCY  PROCALCITONIN  HEMOGLOBIN A1C  TROPONIN I (HIGH SENSITIVITY)  TROPONIN I (HIGH SENSITIVITY)    ____________________________________________  EKG  ECG shows sinus tachycardia with ventricular rate of 120, right axis deviation, unremarkable intervals, some nonspecific ST changes in lead II without any other clear evidence of acute ischemia or other significant underlying arrhythmia. ____________________________________________  RADIOLOGY  ED MD interpretation: CTA chest shows no evidence of PE and evidence of improving pneumonia.  No other acute intrathoracic process.  Official radiology report(s): CT Angio Chest PE  W and/or Wo Contrast  Result Date: 05/14/2020 CLINICAL DATA:  20 year old female with palpitations. Multifocal pneumonia on recent CTA. EXAM: CT ANGIOGRAPHY CHEST WITH CONTRAST TECHNIQUE: Multidetector CT imaging of the chest was performed using the standard protocol during bolus administration of intravenous contrast. Multiplanar CT image reconstructions and MIPs were obtained to evaluate the vascular anatomy. CONTRAST:  OMNIPAQUE IOHEXOL 350 MG/ML SOLN COMPARISON:  Chest CTA 05/12/2020. FINDINGS: Cardiovascular: Suboptimal contrast bolus timing in the pulmonary arterial tree. Mild respiratory motion. Still, there is no convincing bilateral pulmonary artery filling defect to suggest pulmonary embolus. Negative visible aorta, proximal great vessels. No cardiomegaly or pericardial effusion. No calcified coronary artery atherosclerosis is evident. Mediastinum/Nodes: Negative.  No lymphadenopathy. Lungs/Pleura: Stable lung volumes. Major airways remain patent. Left lower lobe pneumonia with regressed but not resolved peribronchial opacity in the same distribution seen 2 days ago. No significant consolidation now, largely sub solid ground-glass opacity. Minimal superior segment right lower lobe involvement persists. But no new areas of lung involvement are identified. No pleural effusion. Upper Abdomen: Negative visible liver, spleen, stomach (more distended today).  Musculoskeletal: Negative. Review of the MIP images confirms the above findings. IMPRESSION: 1. Suboptimal pulmonary artery contrast but negative for acute pulmonary embolus. 2. Regression of left lower lobe pneumonia from 2 days ago. Continued mild involvement of the superior segment right lower lobe. No new areas of involvement, no new pulmonary abnormality. Electronically Signed   By: Odessa Fleming M.D.   On: 05/14/2020 04:18    ____________________________________________   PROCEDURES  Procedure(s) performed (including Critical Care):  .1-3 Lead EKG Interpretation Performed by: Gilles Chiquito, MD Authorized by: Gilles Chiquito, MD     Interpretation: abnormal     ECG rate assessment: tachycardic     Rhythm: sinus tachycardia     Ectopy: none     Conduction: normal       ____________________________________________   INITIAL IMPRESSION / ASSESSMENT AND PLAN / ED COURSE      Patient presents with above-stated history exam for assessment of palpitations in the setting of recent diagnosis of pneumonia and associated symptoms described below.  Per EMS this is in the setting of recent ingestion of CBD edible.  Her heart rate per EMS was in the 130s on arrival is 115 otherwise stable vital signs on room air.  She is obese and tachycardic on arrival but otherwise has clear lungs.  Differential includes but is not limited to side effect from CBD, dehydration, symptomatic anemia, sepsis, PE, arrhythmia, ACS/myocarditis and metabolic derangements.  With regard to patient's increased urinary frequency will check for evidence of infection although she has not noted to be slightly hyperglycemic which could be from recent prednisone use.  CT obtained shows improving pneumonia without evidence of PE.  Covid PCR is negative.  ECG shows no clear evidence of ischemia and initial troponin is nonelevated not consistent with myocarditis.  Very low suspicion for ACS at this time.  CBC shows mild  leukocytosis at 11.6 with no acute anemia or other significant ranges.  Certainly possible this is related to patient's infection versus reactive in the setting of prednisone prescription.  CMP remarkable for glucose of 262 with no other significant derangements.  No evidence of acidosis.  Magnesium WNL.  Urine pregnancy test is negative.  UA is consistent with cystitis with positive nitrites.  Will write Rx for Keflex.    On my reassessment patient states she felt much better and her heart rate is noted to be  in the 90s.  Suspect side effect from [facility] edible in the setting of pneumonia and cystitis.  Advised patient to have her sugar rechecked by her PCP on a nonemergent basis and return immediately to the emergency room if she experiences any new or acute worsening of symptoms.  Counseled importance of nicotine and THC of cessation.   ____________________________________________   FINAL CLINICAL IMPRESSION(S) / ED DIAGNOSES  Final diagnoses:  Community acquired pneumonia, unspecified laterality  Palpitations  Cannabis derivative overdose, accidental or unintentional, initial encounter  Hyperglycemia  Acute cystitis without hematuria    Medications  lactated ringers bolus 1,000 mL (1,000 mLs Intravenous New Bag/Given 05/14/20 0256)  iohexol (OMNIPAQUE) 350 MG/ML injection 100 mL (100 mLs Intravenous Contrast Given 05/14/20 0355)     ED Discharge Orders         Ordered    cephALEXin (KEFLEX) 500 MG capsule  4 times daily        05/14/20 0455           Note:  This document was prepared using Dragon voice recognition software and may include unintentional dictation errors.   Gilles Chiquito, MD 05/14/20 [phone]

## 2020-05-14 NOTE — ED Triage Notes (Signed)
[facility]-- c/o heart racing with report of ingesting an edible arrange 4-5hrs pta and recent dx of PNA-- HR 139 for first responders on scene; ems reports HR slowed to 120s upon their arrival and O2 99% on RA - EMS also reports posterior lung sounds CTA with exception of posterior LLL adventitious lung sounds.

## 2020-05-14 NOTE — ED Notes (Signed)
Patient transported to CT 

## 2020-05-14 NOTE — ED Notes (Signed)
Pt agreeable with d/c plan as discussed by Dr Michiel Sites - this nurse has verbally reinforced d/c instructions and provided pt with written copy- pt acknowledges verbal understanding and denies any additional questions, concerns, needs - pt to call an [facility] for transport home

## 2020-05-15 LAB — URINE CULTURE: Culture: <10000 — AB

## 2020-05-19 ENCOUNTER — Encounter: Payer: Self-pay | Admitting: Adult Health

## 2020-05-19 ENCOUNTER — Other Ambulatory Visit: Payer: Self-pay

## 2020-05-19 ENCOUNTER — Ambulatory Visit: Payer: BC Managed Care – PPO | Admitting: Pulmonary Disease

## 2020-05-19 ENCOUNTER — Ambulatory Visit (INDEPENDENT_AMBULATORY_CARE_PROVIDER_SITE_OTHER): Payer: BC Managed Care – PPO | Admitting: Adult Health

## 2020-05-19 DIAGNOSIS — Z720 Tobacco use: Secondary | ICD-10-CM

## 2020-05-19 DIAGNOSIS — J4521 Mild intermittent asthma with (acute) exacerbation: Secondary | ICD-10-CM

## 2020-05-19 DIAGNOSIS — J189 Pneumonia, unspecified organism: Secondary | ICD-10-CM | POA: Diagnosis not present

## 2020-05-19 DIAGNOSIS — J45909 Unspecified asthma, uncomplicated: Secondary | ICD-10-CM | POA: Insufficient documentation

## 2020-05-19 MED ORDER — BUDESONIDE-FORMOTEROL FUMARATE 80-4.5 MCG/ACT IN AERO: 1.0000 | INHALATION_SPRAY | Freq: Two times a day (BID) | RESPIRATORY_TRACT | 1 refills | Status: DC

## 2020-05-19 MED ORDER — BENZONATATE 200 MG PO CAPS: 200.0000 mg | ORAL_CAPSULE | Freq: Three times a day (TID) | ORAL | 1 refills | Status: DC | PRN

## 2020-05-19 NOTE — Patient Instructions (Addendum)
Finish Antibiotics.  Begin Symbicort 80 1 puff Twice daily, rinse after  Albuterol inhaler As needed   Mucinex DM Twice daily  As needed  Cough/congestion  Tessalon Three times a day   Zyrtec 10mg  At bedtime  As needed  Drainage .  Stop smoking  Follow up with in 3 weeks with Dr. or Bogdan Vivona in [place] .  with chest xray  Please contact office for sooner follow up if symptoms do not improve or worsen or seek emergency care

## 2020-05-19 NOTE — Progress Notes (Signed)
@Patient  ID: , female    DOB: [birth year], 20 y.o.   MRN: [id number]  Chief Complaint  Patient presents with  . Follow-up    Referring provider: No ref. provider found  HPI: 20 year old female active smoker (vaping) seen September 2019 for chronic cough and asthmatic bronchitis History of PE while on birth control.   TEST/EVENTS :   05/19/2020 Follow up : Pneumonia, emergency room visit Patient presents from a recent emergency room visit for pneumonia.  Patient says over the last 2 weeks she has had cough congestion sinus pressure body aches and fever.  CT chest showed showed multifocal pneumonia.  Negative for PE.  Patient had COVID-19 in early January.  She had been vaccinated x2 but no booster. Patient does vape.  Says that her roommate recently had bronchitis and they had shared a vape.  Patient denies any hemoptysis, chest pain, orthopnea, edema.  In the emergency room she was treated with a Z-Pak and a prednisone taper.  She went back to the emergency room with ongoing symptoms.  CT on February 9 showed no PE.  And regression of left lower lobe pneumonia.  An improvement in the right lower lobe.  Patient was noted to have a urinary tract infection was put on Keflex.  Patient has now finished her Z-Pak and has a few days left on her Keflex. She says she continues to feel tired has ongoing cough.  No fever or body aches.  Appetite is good with no nausea vomiting or diarrhea.     No Known Allergies  Immunization History  Administered Date(s) Administered  . Influenza,inj,Quad PF,6+ Mos 02/03/2018  . PFIZER(Purple Top)SARS-COV-2 Vaccination 06/30/2019, 07/21/2019  . Pneumococcal-Unspecified 09/06/2017    Past Medical History:  Diagnosis Date  . ADHD (attention deficit hyperactivity disorder)   . Anxiety     Tobacco History: Social History   Tobacco Use  Smoking Status Current Some Day Smoker  . Types: E-cigarettes  Smokeless Tobacco Never Used  Tobacco  Comment   uses vape daily, 5 times daily   Ready to quit: No Counseling given: Yes Comment: uses vape daily, 5 times daily   Outpatient Medications Prior to Visit  Medication Sig Dispense Refill  . albuterol (VENTOLIN HFA) 108 (90 Base) MCG/ACT inhaler Inhale 2 puffs into the lungs every 4 (four) hours as needed for wheezing or shortness of breath. 1 each 1  . buPROPion (WELLBUTRIN XL) 150 MG 24 hr tablet     . cephALEXin (KEFLEX) 500 MG capsule Take 1 capsule (500 mg total) by mouth 4 (four) times daily for 7 days. 28 capsule 0  . FLUoxetine (PROZAC) 40 MG capsule     . norethindrone (MICRONOR) 0.35 MG tablet Norlyda 0.35 mg tablet    . azithromycin (ZITHROMAX) 250 MG tablet 2 tablets today, then 1 tablet for the next 4 days. (Patient not taking: Reported on 05/19/2020) 6 each 0  . cetirizine (ZYRTEC) 10 MG tablet Take 10 mg by mouth daily. (Patient not taking: Reported on 05/19/2020)    . fluticasone (FLOVENT DISKUS) 50 MCG/BLIST diskus inhaler Inhale 1 puff into the lungs 2 (two) times daily. 1 Inhaler 12  . methylPREDNISolone (MEDROL DOSEPAK) 4 MG TBPK tablet methylprednisolone 4 mg tablets in a dose pack (Patient not taking: Reported on 05/19/2020)     No facility-administered medications prior to visit.     Review of Systems:   Constitutional:   No  weight loss, night sweats,  Fevers, chills, + fatigue, or  lassitude.  HEENT:   No headaches,  Difficulty swallowing,  Tooth/dental problems, or  Sore throat,                No sneezing, itching, ear ache,  +nasal congestion, post nasal drip,   CV:  No chest pain,  Orthopnea, PND, swelling in lower extremities, anasarca, dizziness, palpitations, syncope.   GI  No heartburn, indigestion, abdominal pain, nausea, vomiting, diarrhea, change in bowel habits, loss of appetite, bloody stools.   Resp:   No chest wall deformity  Skin: no rash or lesions.  GU: no dysuria, change in color of urine, no urgency or frequency.  No flank pain,  no hematuria   MS:  No joint pain or swelling.  No decreased range of motion.  No back pain.    Physical Exam  BP 90/70 (BP Location: Left Arm, Cuff Size: Large)   Pulse (!) 103   Temp 97.7 F (36.5 C) (Temporal)   Ht 5\' 5"  (1.651 m)   Wt 262 lb 9.6 oz (119.1 kg)   SpO2 99%   BMI 43.70 kg/m   GEN: A/Ox3; pleasant , NAD, BMI 43   HEENT:  [place]/AT,   NOSE-clear, THROAT-clear, no lesions, no postnasal drip or exudate noted.   NECK:  Supple w/ fair ROM; no JVD; normal carotid impulses w/o bruits; no thyromegaly or nodules palpated; no lymphadenopathy.    RESP    Clear  P & A; w/o, wheezes/ rales/ or rhonchi. no accessory muscle use, no dullness to percussion  CARD:  RRR, no m/r/g, no peripheral edema, pulses intact, no cyanosis or clubbing.  GI:   Soft & nt; nml bowel sounds; no organomegaly or masses detected.   Musco: Warm bil, no deformities or joint swelling noted.   Neuro: alert, no focal deficits noted.    Skin: Warm, no lesions or rashes    Lab Results:   BMET  No results found for: BNP  ProBNP No results found for: PROBNP  Imaging: DG Chest 2 View  Result Date: 05/12/2020 CLINICAL DATA:  Shortness of breath. EXAM: CHEST - 2 VIEW COMPARISON:  February 03, 2018. FINDINGS: The heart size and mediastinal contours are within normal limits. No pneumothorax or pleural effusion is noted. Right lung is clear. Minimal left basilar atelectasis or infiltrate is noted. The visualized skeletal structures are unremarkable. IMPRESSION: Minimal left basilar atelectasis or infiltrate is noted. Electronically Signed   By: Lupita RaiderJames  Green Jr M.D.   On: 05/12/2020 08:39   CT Angio Chest PE W and/or Wo Contrast  Result Date: 05/14/2020 CLINICAL DATA:  20 year old female with palpitations. Multifocal pneumonia on recent CTA. EXAM: CT ANGIOGRAPHY CHEST WITH CONTRAST TECHNIQUE: Multidetector CT imaging of the chest was performed using the standard protocol during bolus administration of  intravenous contrast. Multiplanar CT image reconstructions and MIPs were obtained to evaluate the vascular anatomy. CONTRAST:  100mL OMNIPAQUE IOHEXOL 350 MG/ML SOLN COMPARISON:  Chest CTA 05/12/2020. FINDINGS: Cardiovascular: Suboptimal contrast bolus timing in the pulmonary arterial tree. Mild respiratory motion. Still, there is no convincing bilateral pulmonary artery filling defect to suggest pulmonary embolus. Negative visible aorta, proximal great vessels. No cardiomegaly or pericardial effusion. No calcified coronary artery atherosclerosis is evident. Mediastinum/Nodes: Negative.  No lymphadenopathy. Lungs/Pleura: Stable lung volumes. Major airways remain patent. Left lower lobe pneumonia with regressed but not resolved peribronchial opacity in the same distribution seen 2 days ago. No significant consolidation now, largely sub solid ground-glass opacity. Minimal superior segment right lower lobe involvement persists.  But no new areas of lung involvement are identified. No pleural effusion. Upper Abdomen: Negative visible liver, spleen, stomach (more distended today). Musculoskeletal: Negative. Review of the MIP images confirms the above findings. IMPRESSION: 1. Suboptimal pulmonary artery contrast but negative for acute pulmonary embolus. 2. Regression of left lower lobe pneumonia from 2 days ago. Continued mild involvement of the superior segment right lower lobe. No new areas of involvement, no new pulmonary abnormality. Electronically Signed   By: Odessa Fleming M.D.   On: 05/14/2020 04:18   CT Angio Chest PE W and/or Wo Contrast  Result Date: 05/12/2020 CLINICAL DATA:  Suspected PE. Cough and body aches and fever for 3-5 days. EXAM: CT ANGIOGRAPHY CHEST WITH CONTRAST TECHNIQUE: Multidetector CT imaging of the chest was performed using the standard protocol during bolus administration of intravenous contrast. Multiplanar CT image reconstructions and MIPs were obtained to evaluate the vascular anatomy.  CONTRAST:  32mL OMNIPAQUE IOHEXOL 350 MG/ML SOLN COMPARISON:  Chest x-ray of May 12, 2020 FINDINGS: Cardiovascular: Heart size is normal without pericardial effusion. Aortic caliber is normal. Contour of the aorta is smooth. Central pulmonary arteries are normal caliber. No sign of pulmonary embolism to the segmental level. Sub segmental branches show limited assessment. Mediastinum/Nodes: No hilar adenopathy, no mediastinal lymphadenopathy. No axillary lymphadenopathy esophagus grossly normal. Lungs/Pleura: Nodular consolidative changes in the LEFT lung base with associated ground-glass. Similar nodularity at the RIGHT lung base though isolated measuring 7 mm. RIGHT upper lobe ground-glass. No lobar level consolidative process. No pleural effusion. Upper Abdomen: Question lobular hepatic contours and top-normal size of the spleen. No pericholecystic stranding, gallbladder is incompletely imaged. Liver density could be indicative of steatosis though phase of contrast does not allow for assessment. Musculoskeletal: No acute musculoskeletal finding. No destructive bone process. Review of the MIP images confirms the above findings. IMPRESSION: 1. No sign of pulmonary embolism to the segmental level. Sub segmental branches show limited assessment. 2. Signs of multifocal pneumonia, consider the possibility of COVID-19 infection though not entirely typical. Follow-up is suggested to ensure resolution. 3. Question lobular hepatic contours and top-normal size of the spleen. Correlate with any clinical or laboratory evidence of liver disease. Electronically Signed   By: Donzetta Kohut M.D.   On: 05/12/2020 11:59      No flowsheet data found.  No results found for: NITRICOXIDE      Assessment & Plan:   CAP (community acquired pneumonia) Multifocal pneumonia.  Possibly secondary to COVID-19 as she had this 1 month ago.  Patient has received antibiotics and steroids and does have some clinical improvement.   She is also on antibiotics for UTI.  I advised her to finish these antibiotics.  We will need a follow-up x-ray on her return visit.  Pending these results can decide if further CT imaging is indicated. Patient is advance activity as tolerated.  Plan  Patient Instructions  Finish Antibiotics.  Begin Symbicort 80 1 puff Twice daily, rinse after  Albuterol inhaler As needed   Mucinex DM Twice daily  As needed  Cough/congestion  Tessalon Three times a day   Zyrtec 10mg  At bedtime  As needed  Drainage .  Stop smoking  Follow up with in 3 weeks with Dr. or Parrett in [place] .  with chest xray  Please contact office for sooner follow up if symptoms do not improve or worsen or seek emergency care       Vapes nicotine containing substance Smoking cessation discussed  Asthmatic bronchitis Asthmatic bronchitic  flare after COVID-19 and pneumonia.  She has completed a course of steroids.  We will add Symbicort 1 puff twice daily for 1 month on return visit can decide if she needs to continue on this.  Plan Patient Instructions  Finish Antibiotics.  Begin Symbicort 80 1 puff Twice daily, rinse after  Albuterol inhaler As needed   Mucinex DM Twice daily  As needed  Cough/congestion  Tessalon Three times a day   Zyrtec 10mg  At bedtime  As needed  Drainage .  Stop smoking  Follow up with in 3 weeks with Dr. or Parrett in [place] .  with chest xray  Please contact office for sooner follow up if symptoms do not improve or worsen or seek emergency care          [place], NP 05/19/2020

## 2020-05-19 NOTE — Assessment & Plan Note (Signed)
Smoking cessation discussed 

## 2020-05-19 NOTE — Assessment & Plan Note (Signed)
Multifocal pneumonia.  Possibly secondary to COVID-19 as she had this 1 month ago.  Patient has received antibiotics and steroids and does have some clinical improvement.  She is also on antibiotics for UTI.  I advised her to finish these antibiotics.  We will need a follow-up x-ray on her return visit.  Pending these results can decide if further CT imaging is indicated. Patient is advance activity as tolerated.  Plan  Patient Instructions  Finish Antibiotics.  Begin Symbicort 80 1 puff Twice daily, rinse after  Albuterol inhaler As needed   Mucinex DM Twice daily  As needed  Cough/congestion  Tessalon Three times a day   Zyrtec 10mg  At bedtime  As needed  Drainage .  Stop smoking  Follow up with in 3 weeks with Dr. or Garrett Bowring in [place] .  with chest xray  Please contact office for sooner follow up if symptoms do not improve or worsen or seek emergency care

## 2020-05-19 NOTE — Assessment & Plan Note (Signed)
Asthmatic bronchitic flare after COVID-19 and pneumonia.  She has completed a course of steroids.  We will add Symbicort 1 puff twice daily for 1 month on return visit can decide if she needs to continue on this.  Plan Patient Instructions  Finish Antibiotics.  Begin Symbicort 80 1 puff Twice daily, rinse after  Albuterol inhaler As needed   Mucinex DM Twice daily  As needed  Cough/congestion  Tessalon Three times a day   Zyrtec 10mg  At bedtime  As needed  Drainage .  Stop smoking  Follow up with in 3 weeks with Dr. or Shameika Speelman in [place] .  with chest xray  Please contact office for sooner follow up if symptoms do not improve or worsen or seek emergency care

## 2020-06-30 ENCOUNTER — Other Ambulatory Visit: Payer: Self-pay

## 2020-06-30 ENCOUNTER — Ambulatory Visit (INDEPENDENT_AMBULATORY_CARE_PROVIDER_SITE_OTHER): Payer: BC Managed Care – PPO | Admitting: Pulmonary Disease

## 2020-06-30 ENCOUNTER — Encounter: Payer: Self-pay | Admitting: Pulmonary Disease

## 2020-06-30 VITALS — BP 110/74 | HR 100 | Temp 97.1°F | Ht 65.0 in | Wt 254.0 lb

## 2020-06-30 DIAGNOSIS — Z720 Tobacco use: Secondary | ICD-10-CM | POA: Diagnosis not present

## 2020-06-30 DIAGNOSIS — J4521 Mild intermittent asthma with (acute) exacerbation: Secondary | ICD-10-CM

## 2020-06-30 NOTE — Patient Instructions (Addendum)
Your lungs sounded really clear today.  Do work on quitting vape use.  Return to see [place] as needed if you develop any  breathing difficulties.

## 2020-06-30 NOTE — Progress Notes (Signed)
Subjective:    Patient ID: Dana Cooper, female    DOB: 01/22/2001, 20 y.o.   MRN: [id number] Chief Complaint  Patient presents with   Follow-up    No current sx.     HPI 20 year old vape user, who presents for follow-up on issues with mild intermittent asthmatic bronchitis.  Fully followed by Dr. Nicholos Johns and last seen by Rubye Oaks, NP on 19 May 2020.  At that time the patient was recovering after COVID 19 diagnosis.  She was started on Symbicort during her last visit but she has discontinued use of this.  She states that she feels like she is "back to normal".  Notes no limitations in her breathing.  She unfortunately continues to vape and has been advised against this. Does not endorse any cough, sputum production, or hemoptysis.  She has not had any fevers, chills or sweats since her COVID-19 diagnosis.  Most of her symptoms of asthmatic bronchitis occurred during the winter months.  Review of Systems A 10 point review of systems was performed and it is as noted above otherwise negative.  Patient Active Problem List   Diagnosis Date Noted   CAP (community acquired pneumonia) 05/19/2020   Vapes nicotine containing substance 05/19/2020   Asthmatic bronchitis 05/19/2020   Social History   Tobacco Use   Smoking status: Some Days    Types: E-cigarettes   Smokeless tobacco: Never   Tobacco comments:    uses vape daily, 5 times daily  Substance Use Topics   Alcohol use: Yes   No Known Allergies  Current Meds  Medication Sig   buPROPion (WELLBUTRIN XL) 150 MG 24 hr tablet    FLUoxetine (PROZAC) 40 MG capsule    norethindrone (MICRONOR) 0.35 MG tablet Norlyda 0.35 mg tablet       Objective:   Physical Exam BP 110/74 (BP Location: Left Arm, Cuff Size: Normal)   Pulse 100   Temp (!) 97.1 F (36.2 C) (Temporal)   Ht 5\' 5"  (1.651 m)   Wt 254 lb (115.2 kg)   SpO2 98%   BMI 42.27 kg/m  GENERAL: Obese young woman, no acute distress, no conversational  dyspnea.  Fully ambulatory. HEAD: Normocephalic, atraumatic.  EYES: Pupils equal, round, reactive to light.  No scleral icterus.  MOUTH: Nose/mouth/throat not examined due to masking requirements for COVID 19. NECK: Supple. No thyromegaly. Trachea midline. No JVD.  No adenopathy. PULMONARY: Good air entry bilaterally.  No adventitious sounds on all lung zones. CARDIOVASCULAR: S1 and S2. Regular rate and rhythm.  ABDOMEN: Obese otherwise benign. MUSCULOSKELETAL: No joint deformity, no clubbing, no edema.  NEUROLOGIC: No focal deficit, no gait disturbance, speech is fluent. SKIN: Intact,warm,dry. PSYCH: Mood and behavior normal.  Chest x-ray requested not performed today.     Assessment & Plan:     ICD-10-CM   1. Mild intermittent asthmatic bronchitis with acute exacerbation - RESOLVED  J45.21    Monitor for recurrent symptoms Follow-up 6 months or on an as-needed basis    2. Vapes nicotine containing substance  Z72.0    Counseled with regards discontinuation of vape use     Patient was advised to continue to monitor for symptoms.  Was also advised to work on quitting vape use.  We will see her in follow-up in 6 months time or on an as-needed basis prior to that.  , MD [facility]    *This note was dictated using voice recognition software/Dragon.  Despite best efforts  to proofread, errors can occur which can change the meaning.  Any change was purely unintentional.

## 2020-11-22 ENCOUNTER — Other Ambulatory Visit: Payer: Self-pay

## 2020-11-22 ENCOUNTER — Emergency Department
Admission: EM | Admit: 2020-11-22 | Discharge: 2020-11-22 | Disposition: A | Discharge status: Home / Self Care | Payer: BC Managed Care – PPO | Attending: Emergency Medicine | Admitting: Emergency Medicine

## 2020-11-22 ENCOUNTER — Emergency Department: Payer: BC Managed Care – PPO

## 2020-11-22 DIAGNOSIS — W228XXA Striking against or struck by other objects, initial encounter: Secondary | ICD-10-CM | POA: Insufficient documentation

## 2020-11-22 DIAGNOSIS — M542 Cervicalgia: Secondary | ICD-10-CM | POA: Diagnosis not present

## 2020-11-22 DIAGNOSIS — J45909 Unspecified asthma, uncomplicated: Secondary | ICD-10-CM | POA: Insufficient documentation

## 2020-11-22 DIAGNOSIS — S0990XA Unspecified injury of head, initial encounter: Secondary | ICD-10-CM | POA: Diagnosis present

## 2020-11-22 DIAGNOSIS — S060X0A Concussion without loss of consciousness, initial encounter: Secondary | ICD-10-CM | POA: Diagnosis not present

## 2020-11-22 DIAGNOSIS — F17290 Nicotine dependence, other tobacco product, uncomplicated: Secondary | ICD-10-CM | POA: Diagnosis not present

## 2020-11-22 DIAGNOSIS — Y9289 Other specified places as the place of occurrence of the external cause: Secondary | ICD-10-CM | POA: Diagnosis not present

## 2020-11-22 DIAGNOSIS — Y9389 Activity, other specified: Secondary | ICD-10-CM | POA: Insufficient documentation

## 2020-11-22 MED ORDER — KETOROLAC TROMETHAMINE 30 MG/ML IJ SOLN
30.0000 mg | Freq: Once | INTRAMUSCULAR | Status: AC
  Administered 2020-11-22: 30 mg via INTRAMUSCULAR
  Filled 2020-11-22: qty 1

## 2020-11-22 NOTE — ED Provider Notes (Signed)
[facility] Emergency Department Provider Note ____________________________________________  Time seen: 1715  I have reviewed the triage vital signs and the nursing notes.  HISTORY  Chief Complaint  Concussion (Pt presents to the ED with complaints of head injury x3 days ago. Pt reports trying to land back on pillow and hitting head on sharp wood. Pt denies LOC. Pt reports nausea, headache, and neck pain. )   HPI Dana Cooper is a 20 y.o. female presents to the ER today with complaint of headache, lightheadedness, excessive sleepiness, neck pain and nausea.  She reports this started 3 days ago after hitting the back of her head on her head forward.  She describes the pain on the right side of her head as throbbing.  She reports sensitivity to light and sound, nausea without vomiting.  She describes the pain in her neck as achy and numb, however this has improved over the last few days.  She does have difficulty with memory but reports this is a chronic issue and she is not sure if it is necessarily worse.  She reports she slept for 6 hours yesterday which is unusual for her.  She has taken Tylenol OTC with minimal relief of symptoms.  She denies history of prior concussion.  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 Authorizing Provider  buPROPion (WELLBUTRIN XL) 150 MG 24 hr tablet     [provider]  FLUoxetine (PROZAC) 40 MG capsule  02/22/19   [provider]  norethindrone (MICRONOR) 0.35 MG tablet Norlyda 0.35 mg tablet    [provider]    Allergies Patient has no known allergies.  Family History  Problem Relation Age of Onset   Anxiety disorder Mother    ADD / ADHD Mother    ADD / ADHD Father     Social History Social History   Tobacco Use   Smoking status: Some Days    Types: E-cigarettes   Smokeless tobacco: Never   Tobacco comments:    uses vape daily, 5 times daily  Vaping Use   Vaping Use: Some days  Substance Use Topics   Alcohol use: Yes   Drug use: Yes    Types: Marijuana    Review of Systems  Constitutional: Negative for fever, chills or body aches. Eyes: Positive for sensitivity to light.  Negative for visual changes. ENT: Positive for sensitivity to sound. Cardiovascular: Negative for chest pain or chest tightness. Respiratory: Negative for cough or shortness of breath. Gastrointestinal: Positive for nausea.  Negative for vomiting. Musculoskeletal: Positive for neck pain.  Negative for back pain. Skin: Negative for bruising, abrasion or hematoma. Neurological: Positive for headache and lightheadedness.  Negative for focal weakness or or tingling. ____________________________________________  PHYSICAL EXAM:  VITAL SIGNS: ED Triage Vitals  Enc Vitals Group     BP 11/22/20 1542 (!) 127/100     Pulse Rate 11/22/20 1542 96     Resp 11/22/20 1542 18     Temp 11/22/20 1542 98.8 F (37.1 C)     Temp Source 11/22/20 1542 Oral  SpO2 11/22/20 1542 99 %     Weight 11/22/20 1543 240 lb (108.9 kg)     Height 11/22/20 1543 5\' 5"  (1.651 m)     Head Circumference --      Peak Flow --      Pain Score 11/22/20 1543 5     Pain Loc --      Pain Edu? --      Excl. in GC? --     Constitutional: Alert and oriented. Well appearing and in no distress. Head: Normocephalic and atraumatic. Eyes:  Normal extraocular movements Cardiovascular: Normal rate, regular rhythm.  Respiratory: Normal respiratory effort. No wheezes/rales/rhonchi. Musculoskeletal: Normal flexion, extension, rotation and  lateral bending of the cervical spine.  No bony tenderness noted over the cervical spine.  Shoulder shrugs equal.  Strength 5/5 BUE. Neurologic:  Normal gait without ataxia. Normal speech and language. No gross focal neurologic deficits are appreciated. Skin:  Skin is warm, dry and intact. No bruising or abrasion noted. ____________________________________________   RADIOLOGY Imaging Orders         CT HEAD WO CONTRAST (11/24/20)         CT Cervical Spine Wo Contrast    IMPRESSION: No acute intracranial abnormality. IMPRESSION: No acute intracranial abnormality. IMPRESSION: No acute cervical spine fracture.  ____________________________________________   INITIAL IMPRESSION / ASSESSMENT AND PLAN / ED COURSE  Acute Headache, Lightheadedness, Acute Neck Pain, Nausea:  CT head negative for acute findings CT cervical spine negative for acute findings Toradol 30 mg IM x 1 Discussed that she likely has a concussion, will need brain rest until symptoms improve Ok to take Tylenol/Ibuprofen OTC but to avoid medication overuse Will have her follow up with neurology as an outpatient    ____________________________________________  FINAL CLINICAL IMPRESSION(S) / ED DIAGNOSES  Final diagnoses:  Concussion without loss of consciousness, initial encounter      , NP 11/22/20 1846    11/24/20, MD 11/22/20 2005

## 2020-11-22 NOTE — Discharge Instructions (Addendum)
You were seen today for a head injury.  The CT of your head and cervical spine are negative for acute findings.  You likely have a concussion.  We recommend brain rest until your symptoms improve.  You can take Tylenol ibuprofen OTC as needed according to the package directions for headaches but avoid overuse as this can cause rebound headaches.  Please follow-up with neurology if symptoms persist or worsen.

## 2021-01-27 ENCOUNTER — Encounter: Payer: Self-pay | Admitting: Pulmonary Disease

## 2022-12-20 IMAGING — CT CT HEAD W/O CM
4 series · 17 of 47 positions shown, 19 images · non-contrast
Comparison: None.

CLINICAL DATA: Head trauma, focal neuro findings (Age 19-64y)

EXAM:
CT HEAD WITHOUT CONTRAST
TECHNIQUE: Contiguous axial images were obtained from the base of the skull
through the vertex without intravenous contrast.

[Series 2: head wo · axial · 0.46mm/px · z∈[+444,+564]mm · 7 of 33 slices shown, 9 images]
[im 5/33  brain]
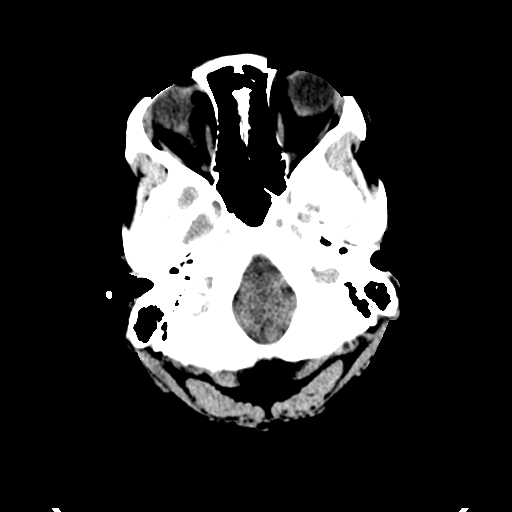
[im 5/33  bone]
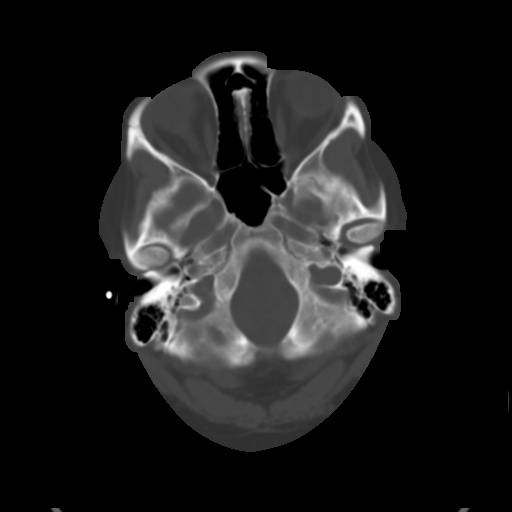
[im 9/33  brain]
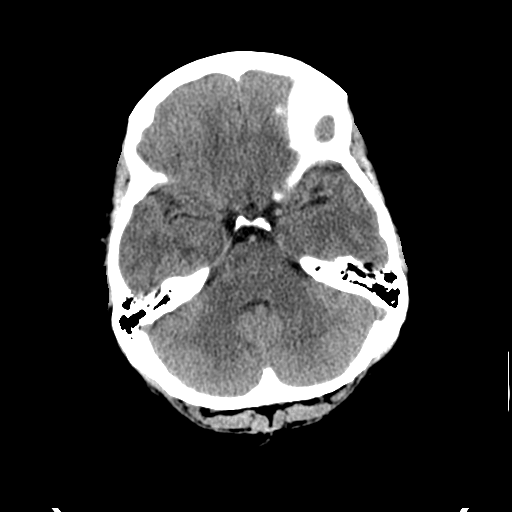
[im 13/33  brain]
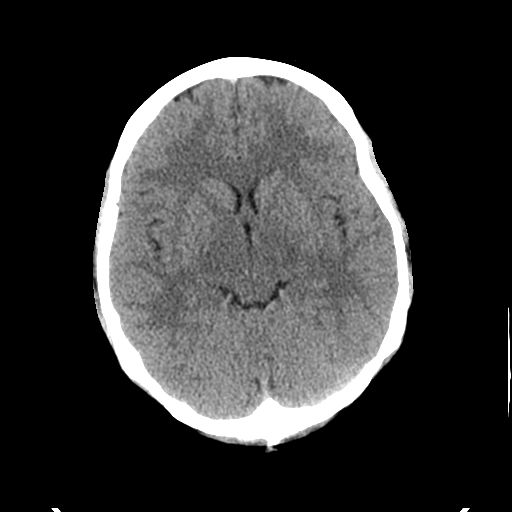
[im 17/33  brain]
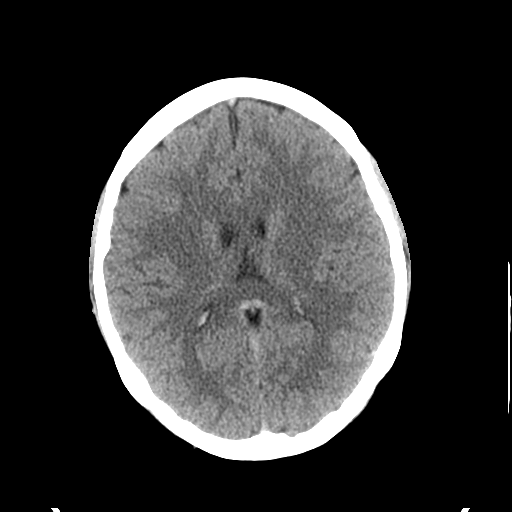
[im 21/33  brain]
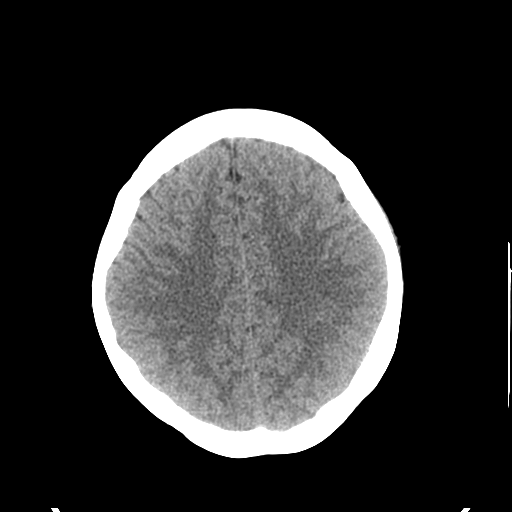
[im 21/33  bone]
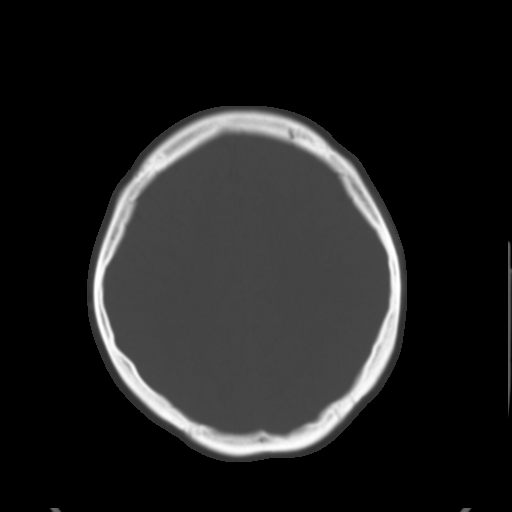
[im 25/33  brain]
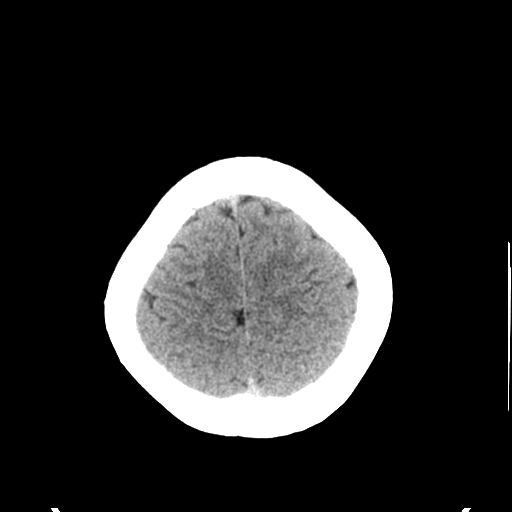
[im 29/33  brain]
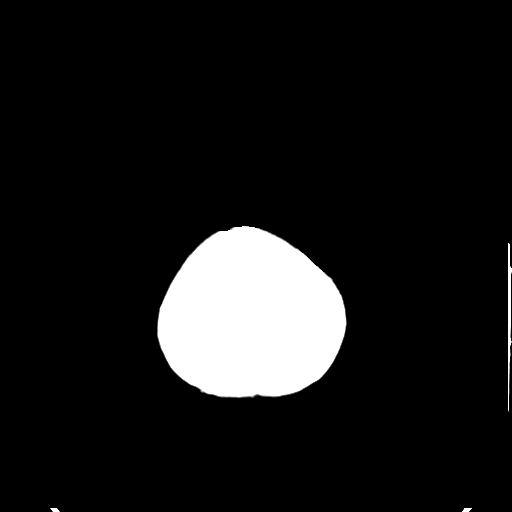

[Series 3: head bone · axial · 0.46mm/px · z∈[+440,+496]mm · 4 of 83 slices shown]
[im 9/83  bone]
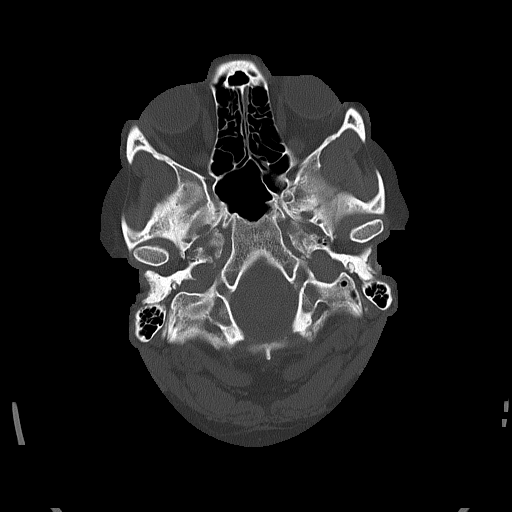
[im 17/83  bone]
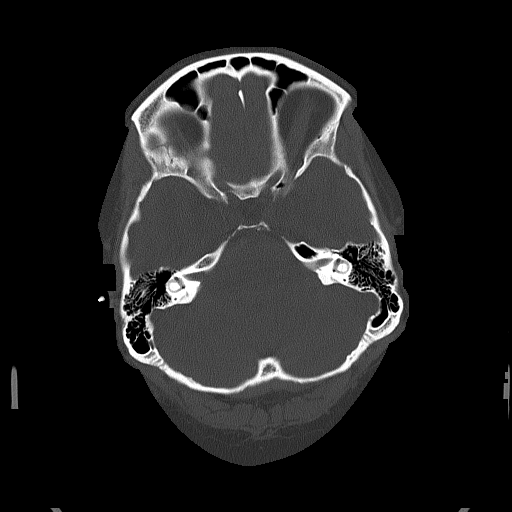
[im 25/83  bone]
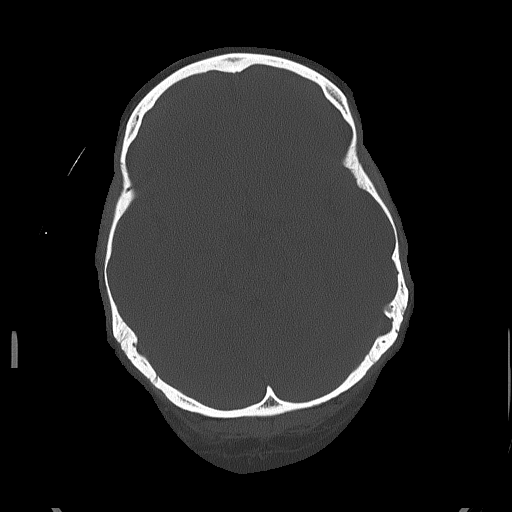
[im 37/83  bone]
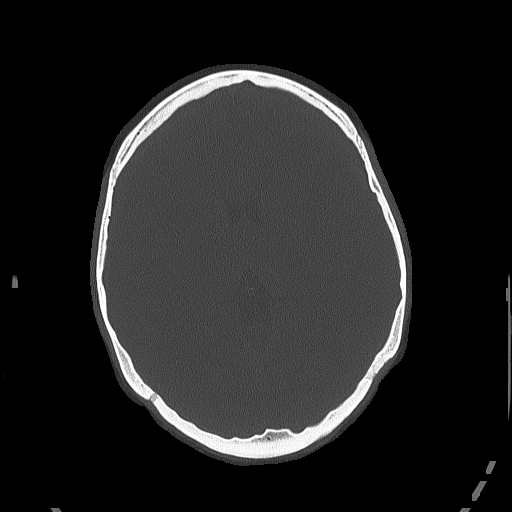

[Series 4: coronal soft tissue · coronal · 0.34mm/px · 3 of 72 slices shown]
[im 24/72  brain]
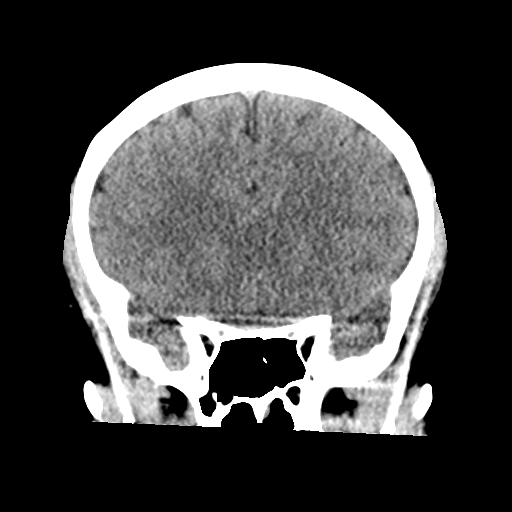
[im 32/72  brain]
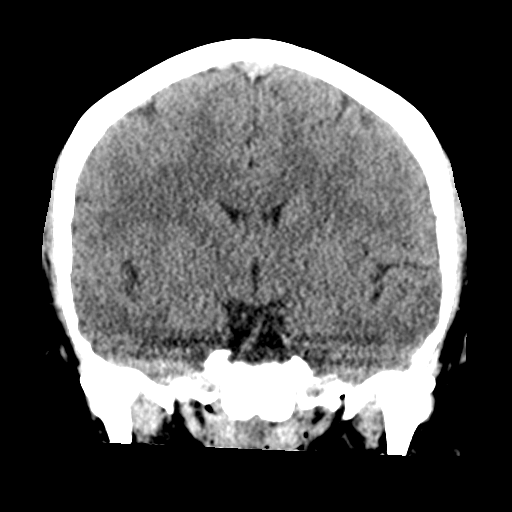
[im 40/72  brain]
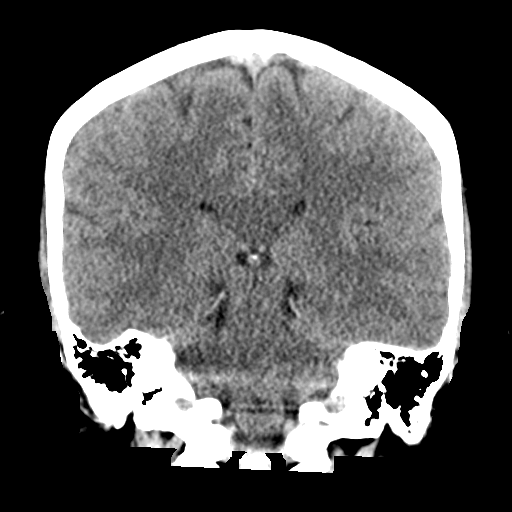

[Series 5: sagittal soft tissue · sagittal · 0.34mm/px · 3 of 59 slices shown]
[im 20/59  brain]
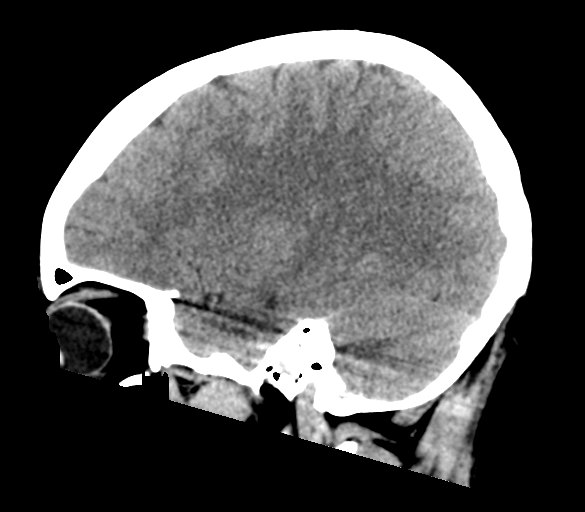
[im 30/59  brain]
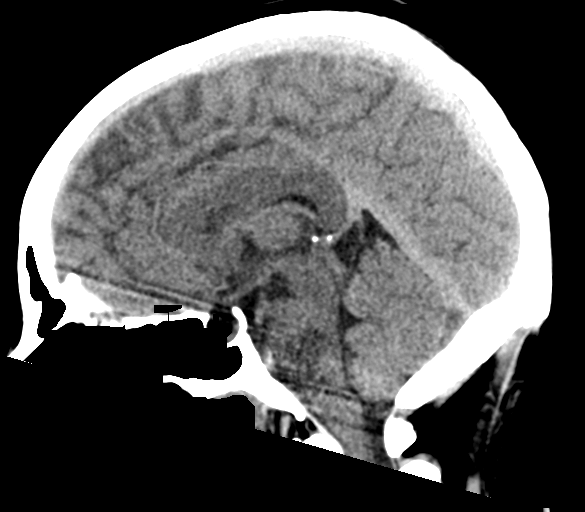
[im 39/59  brain]
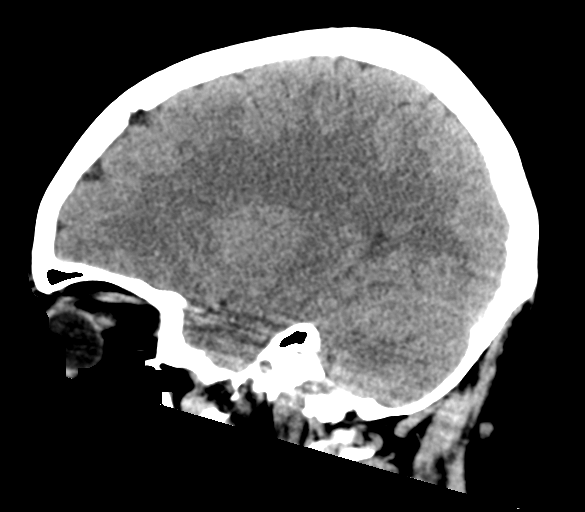

[17 of 47 positions shown; findings below may reference images not displayed]

FINDINGS: Brain: No evidence of acute intracranial hemorrhage or extra-axial
collection.No evidence of mass lesion/concern mass effect.The
ventricles are normal in size.

Vascular: No hyperdense vessel or unexpected calcification.

Skull: Normal. Negative for fracture or focal lesion.

Sinuses/Orbits: No acute finding.

Other: None.
IMPRESSION: No acute intracranial abnormality.

## 2022-12-20 IMAGING — CT CT CERVICAL SPINE W/O CM
3 of 4 series · 13 of 33 positions shown, 16 images · non-contrast
Comparison: None.

CLINICAL DATA: Neck trauma, abnormal mental status or neuro exam
(Ped 3-15y)

EXAM:
CT CERVICAL SPINE WITHOUT CONTRAST
TECHNIQUE: Multidetector CT imaging of the cervical spine was performed without
intravenous contrast. Multiplanar CT image reconstructions were also
generated.

[Series 6: orthogonal bone · axial · 0.27mm/px · z∈[+260,+390]mm · 5 of 100 slices shown, 7 images]
[im 17/100  soft-tissue]
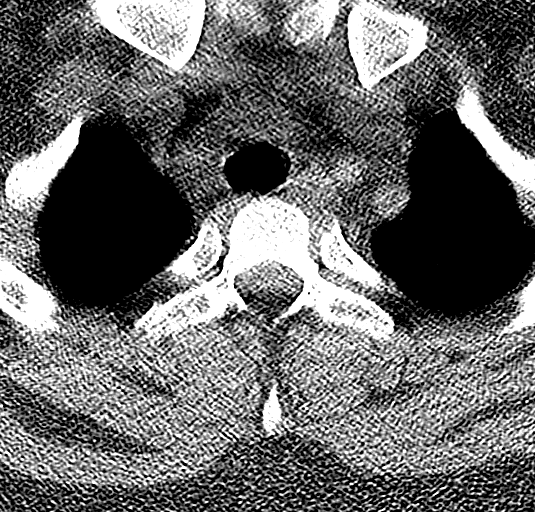
[im 17/100  bone]
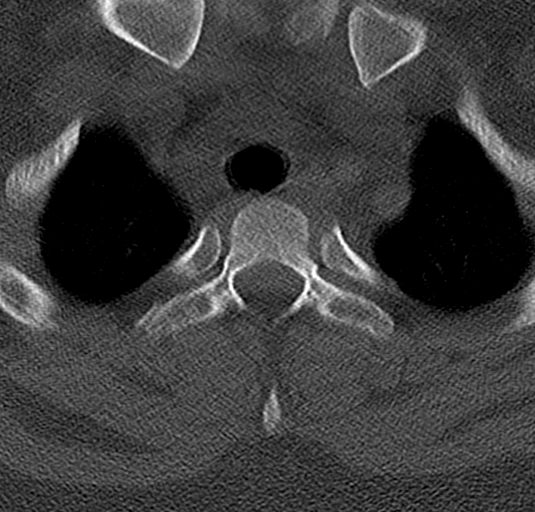
[im 34/100  bone]
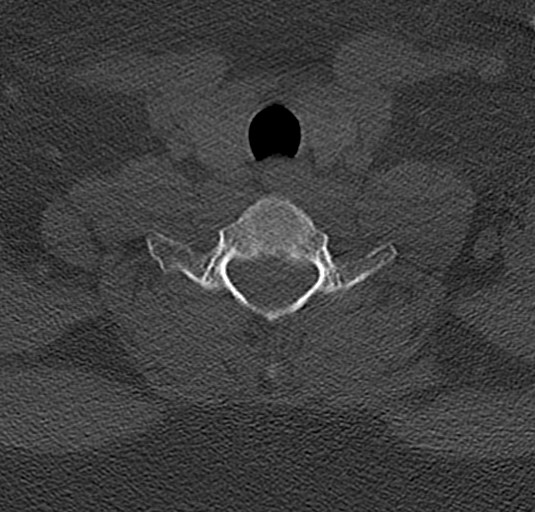
[im 50/100  bone]
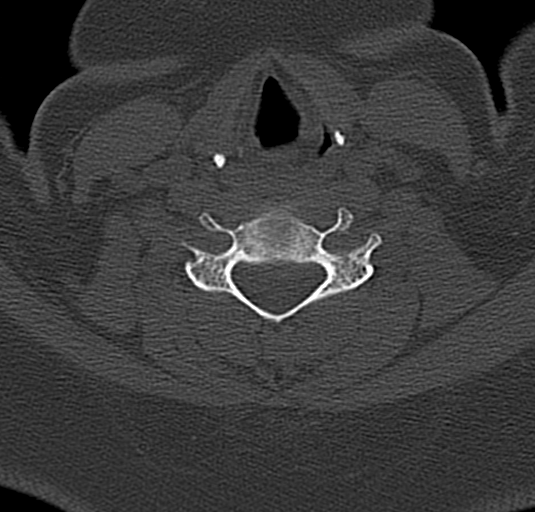
[im 67/100  bone]
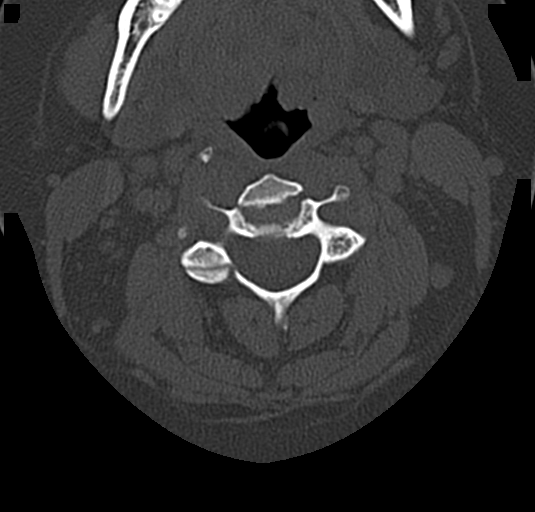
[im 83/100  soft-tissue]
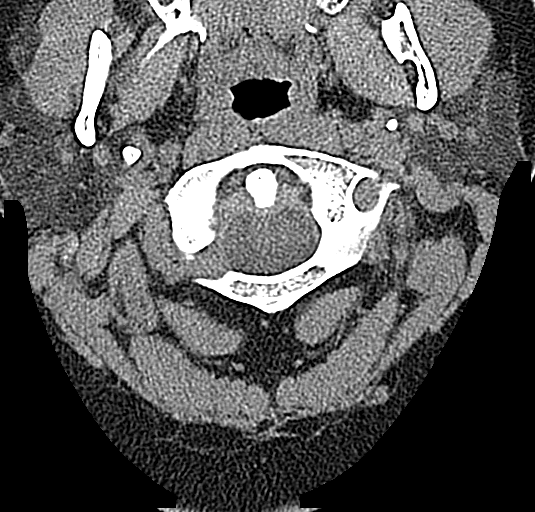
[im 83/100  bone]
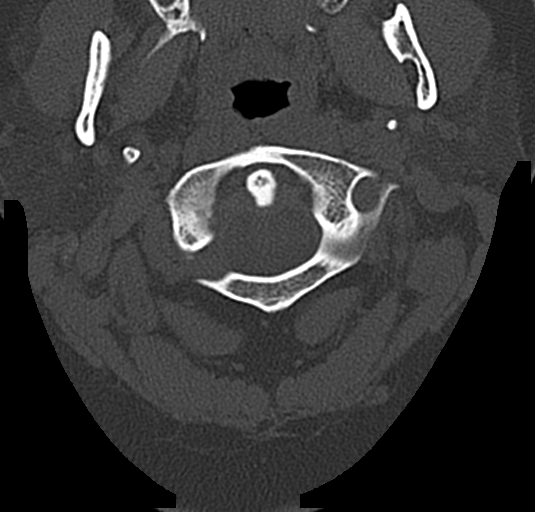

[Series 7: sagittal bone · sagittal · 0.30mm/px · 5 of 84 slices shown, 6 images]
[im 28/84  bone]
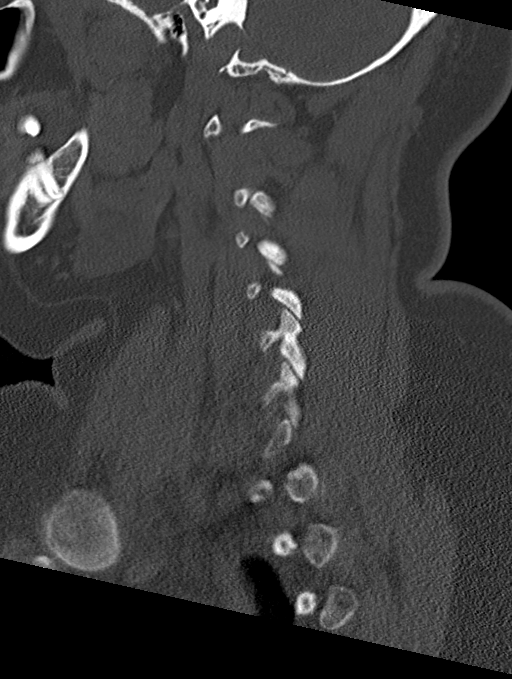
[im 35/84  bone]
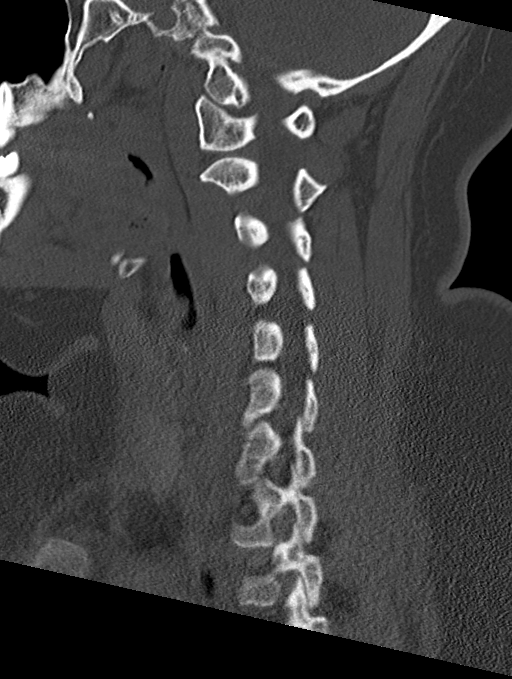
[im 42/84  soft-tissue]
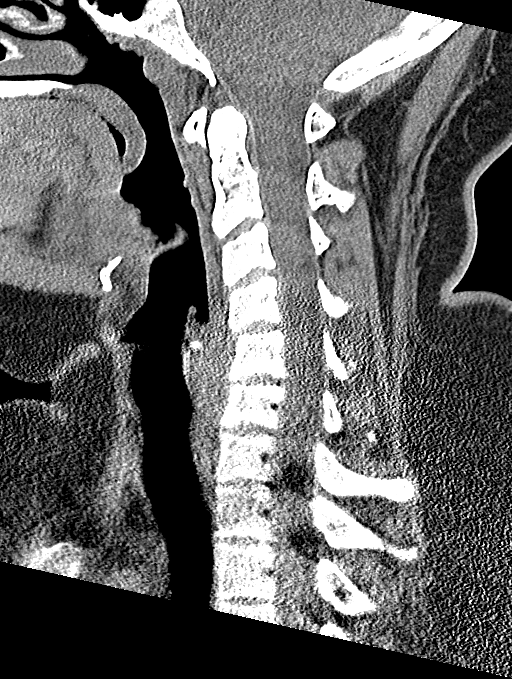
[im 42/84  bone]
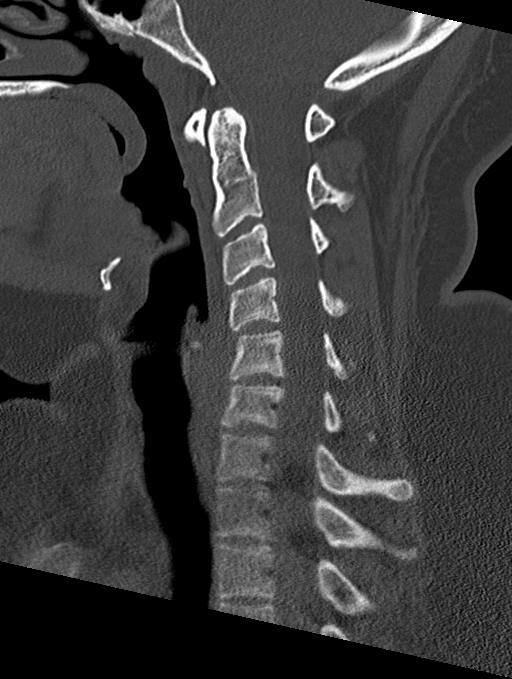
[im 49/84  bone]
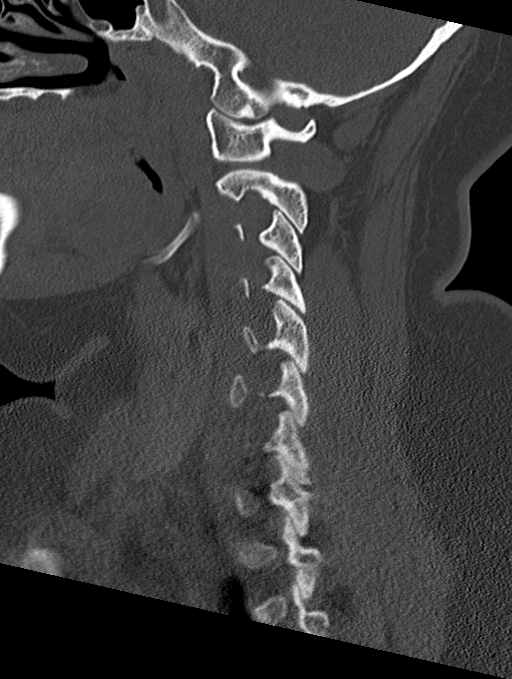
[im 56/84  bone]
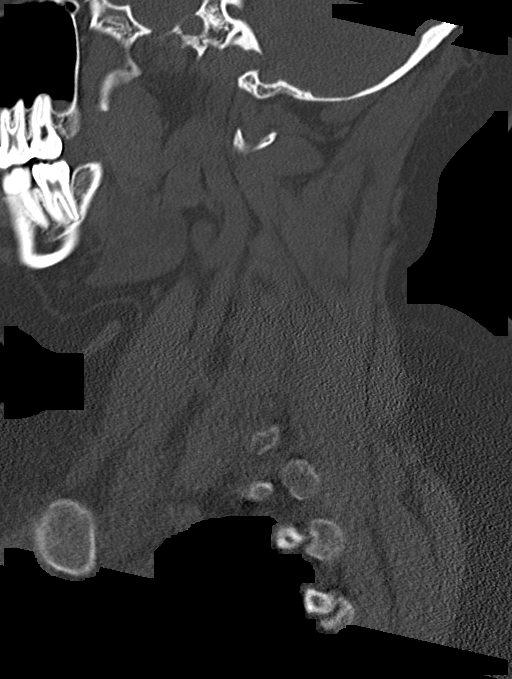

[Series 8: coronal bone · coronal · 0.30mm/px · 3 of 63 slices shown]
[im 13/63  bone]
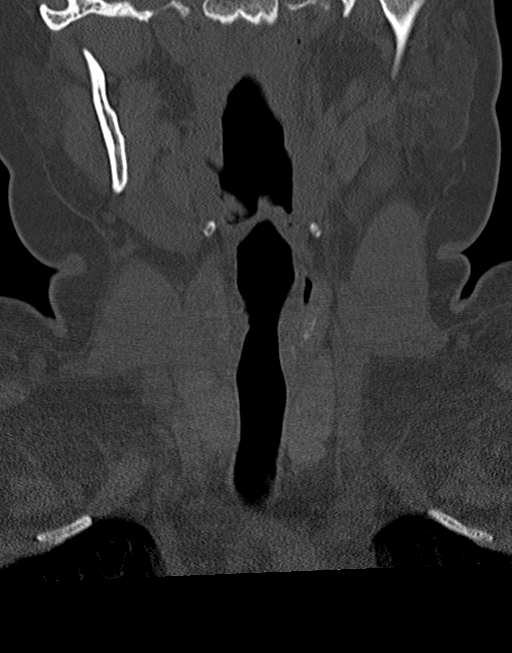
[im 25/63  bone]
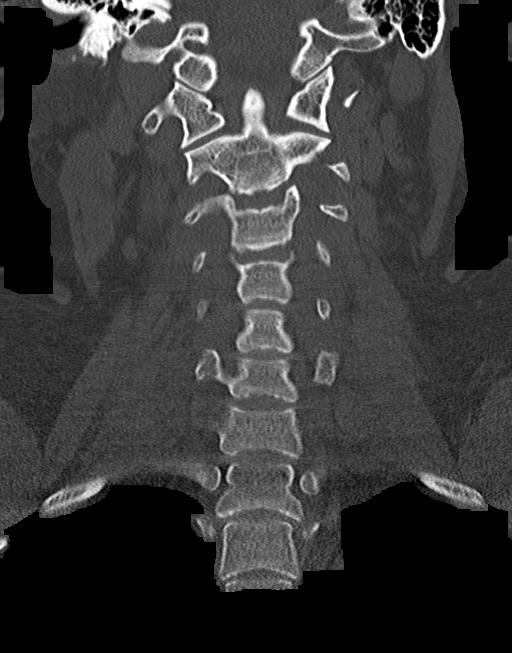
[im 38/63  bone]
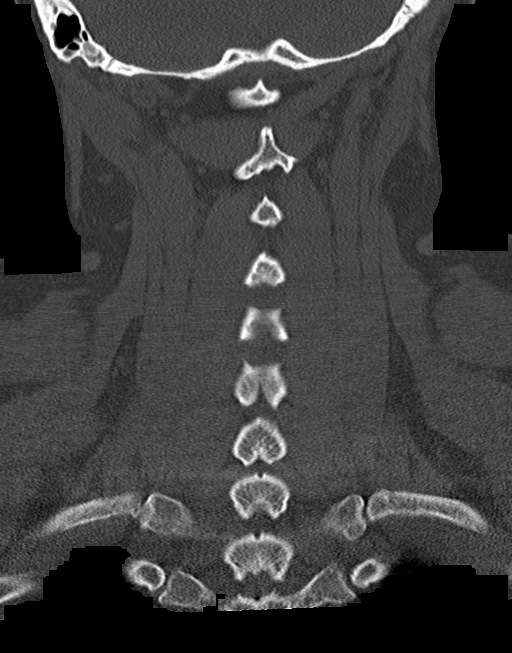

[13 of 33 positions shown; findings below may reference images not displayed]

FINDINGS: Alignment: Straightening of the cervical lordosis with slight
reversal, likely due to patient positioning.

Skull base and vertebrae: No acute fracture. No primary bone lesion
or focal pathologic process.

Soft tissues and spinal canal: No prevertebral fluid or swelling. No
visible canal hematoma.

Disc levels: Preserved disc spaces.No large disc herniation,
significant spinal canal or neuroforaminal narrowing.

Upper chest: Negative.

Other: None.
IMPRESSION: No acute cervical spine fracture.
